# Patient Record
Sex: Female | Born: 1935 | Hispanic: No | Smoking: Never smoker
Health system: Southern US, Community
[De-identification: ages and names within clinical notes are randomized; demographics above are authoritative.]

## PROBLEM LIST (undated history)

## (undated) DIAGNOSIS — I509 Heart failure, unspecified: Secondary | ICD-10-CM

## (undated) DIAGNOSIS — M1990 Unspecified osteoarthritis, unspecified site: Secondary | ICD-10-CM

## (undated) DIAGNOSIS — C8589 Other specified types of non-Hodgkin lymphoma, extranodal and solid organ sites: Secondary | ICD-10-CM

## (undated) DIAGNOSIS — I10 Essential (primary) hypertension: Secondary | ICD-10-CM

## (undated) DIAGNOSIS — C8339 Primary central nervous system lymphoma: Secondary | ICD-10-CM

## (undated) HISTORY — DX: Heart failure, unspecified: I50.9

## (undated) HISTORY — DX: Essential (primary) hypertension: I10

## (undated) HISTORY — DX: Primary central nervous system lymphoma: C83.390

## (undated) HISTORY — DX: Other specified types of non-hodgkin lymphoma, extranodal and solid organ sites: C85.89

## (undated) HISTORY — DX: Unspecified osteoarthritis, unspecified site: M19.90

---

## 2005-01-03 ENCOUNTER — Ambulatory Visit: Payer: Self-pay

## 2005-01-10 ENCOUNTER — Ambulatory Visit: Payer: Self-pay

## 2005-10-14 ENCOUNTER — Ambulatory Visit: Payer: Self-pay

## 2006-04-24 ENCOUNTER — Other Ambulatory Visit: Payer: Self-pay

## 2006-04-24 ENCOUNTER — Emergency Department: Payer: Self-pay | Admitting: Unknown Physician Specialty

## 2006-07-15 ENCOUNTER — Emergency Department: Payer: Self-pay | Admitting: Unknown Physician Specialty

## 2006-09-11 ENCOUNTER — Ambulatory Visit: Payer: Self-pay

## 2006-10-22 ENCOUNTER — Ambulatory Visit: Payer: Self-pay

## 2006-12-17 ENCOUNTER — Ambulatory Visit: Payer: Self-pay

## 2007-09-06 ENCOUNTER — Ambulatory Visit: Payer: Self-pay | Admitting: Orthopedic Surgery

## 2007-09-06 ENCOUNTER — Other Ambulatory Visit: Payer: Self-pay

## 2007-09-13 ENCOUNTER — Inpatient Hospital Stay: Payer: Self-pay | Admitting: Orthopedic Surgery

## 2007-09-16 ENCOUNTER — Encounter: Payer: Self-pay | Admitting: Internal Medicine

## 2008-01-19 ENCOUNTER — Ambulatory Visit: Payer: Self-pay

## 2008-03-31 ENCOUNTER — Ambulatory Visit: Payer: Self-pay | Admitting: Orthopedic Surgery

## 2008-04-04 ENCOUNTER — Ambulatory Visit: Payer: Self-pay | Admitting: Orthopedic Surgery

## 2008-05-24 ENCOUNTER — Ambulatory Visit: Payer: Self-pay | Admitting: Neurology

## 2009-01-19 ENCOUNTER — Ambulatory Visit: Payer: Self-pay

## 2009-11-22 ENCOUNTER — Emergency Department: Payer: Self-pay | Admitting: Emergency Medicine

## 2010-01-21 ENCOUNTER — Ambulatory Visit: Payer: Self-pay

## 2010-03-25 ENCOUNTER — Ambulatory Visit: Payer: Self-pay | Admitting: Specialist

## 2010-03-25 ENCOUNTER — Ambulatory Visit: Payer: Self-pay | Admitting: Cardiology

## 2010-04-02 ENCOUNTER — Inpatient Hospital Stay: Payer: Self-pay | Admitting: Specialist

## 2010-04-06 ENCOUNTER — Other Ambulatory Visit: Payer: Self-pay | Admitting: Family Medicine

## 2010-04-07 ENCOUNTER — Other Ambulatory Visit: Payer: Self-pay | Admitting: Family Medicine

## 2011-03-12 ENCOUNTER — Ambulatory Visit: Payer: Self-pay

## 2012-07-08 ENCOUNTER — Ambulatory Visit: Payer: Self-pay

## 2014-04-11 ENCOUNTER — Ambulatory Visit: Payer: Self-pay

## 2015-10-02 ENCOUNTER — Other Ambulatory Visit: Payer: Self-pay | Admitting: Internal Medicine

## 2015-10-02 DIAGNOSIS — Z1231 Encounter for screening mammogram for malignant neoplasm of breast: Secondary | ICD-10-CM

## 2015-10-04 ENCOUNTER — Ambulatory Visit: Payer: Self-pay

## 2015-12-14 ENCOUNTER — Ambulatory Visit
Admission: RE | Admit: 2015-12-14 | Discharge: 2015-12-14 | Disposition: A | Discharge status: Home / Self Care | Payer: Medicare Other | Source: Ambulatory Visit | Attending: Internal Medicine | Admitting: Internal Medicine

## 2015-12-14 ENCOUNTER — Other Ambulatory Visit: Payer: Self-pay | Admitting: Internal Medicine

## 2015-12-14 DIAGNOSIS — Z1231 Encounter for screening mammogram for malignant neoplasm of breast: Secondary | ICD-10-CM | POA: Diagnosis present

## 2016-06-09 DIAGNOSIS — I729 Aneurysm of unspecified site: Secondary | ICD-10-CM

## 2016-06-09 HISTORY — DX: Aneurysm of unspecified site: I72.9

## 2016-09-28 ENCOUNTER — Emergency Department
Admission: EM | Admit: 2016-09-28 | Discharge: 2016-09-28 | Disposition: A | Discharge status: Other Acute Inpatient Hospital | Payer: Medicare Other | Attending: Emergency Medicine | Admitting: Emergency Medicine

## 2016-09-28 ENCOUNTER — Emergency Department: Payer: Medicare Other

## 2016-09-28 DIAGNOSIS — Z79899 Other long term (current) drug therapy: Secondary | ICD-10-CM | POA: Insufficient documentation

## 2016-09-28 DIAGNOSIS — I609 Nontraumatic subarachnoid hemorrhage, unspecified: Secondary | ICD-10-CM

## 2016-09-28 DIAGNOSIS — R4182 Altered mental status, unspecified: Secondary | ICD-10-CM | POA: Diagnosis present

## 2016-09-28 LAB — CBC WITH DIFFERENTIAL/PLATELET
Basophils Absolute: 0 10*3/uL (ref 0–0.1)
Basophils Relative: 0 %
EOS PCT: 0 %
Eosinophils Absolute: 0 10*3/uL (ref 0–0.7)
HCT: 37.3 % (ref 35.0–47.0)
Hemoglobin: 12.1 g/dL (ref 12.0–16.0)
LYMPHS PCT: 13 %
Lymphs Abs: 0.8 10*3/uL — ABNORMAL LOW (ref 1.0–3.6)
MCH: 26.6 pg (ref 26.0–34.0)
MCHC: 32.6 g/dL (ref 32.0–36.0)
MCV: 81.6 fL (ref 80.0–100.0)
MONO ABS: 0.3 10*3/uL (ref 0.2–0.9)
MONOS PCT: 6 %
NEUTROS ABS: 5.1 10*3/uL (ref 1.4–6.5)
Neutrophils Relative %: 81 %
Platelets: 266 10*3/uL (ref 150–440)
RBC: 4.57 MIL/uL (ref 3.80–5.20)
RDW: 14.4 % (ref 11.5–14.5)
WBC: 6.3 10*3/uL (ref 3.6–11.0)

## 2016-09-28 LAB — URINALYSIS, COMPLETE (UACMP) WITH MICROSCOPIC
BACTERIA UA: NONE SEEN
Bilirubin Urine: NEGATIVE
GLUCOSE, UA: NEGATIVE mg/dL
KETONES UR: 5 mg/dL — AB
Leukocytes, UA: NEGATIVE
Nitrite: NEGATIVE
PROTEIN: 100 mg/dL — AB
Specific Gravity, Urine: 1.016 (ref 1.005–1.030)
pH: 7 (ref 5.0–8.0)

## 2016-09-28 LAB — BASIC METABOLIC PANEL
Anion gap: 8 (ref 5–15)
BUN: 10 mg/dL (ref 6–20)
CALCIUM: 8.9 mg/dL (ref 8.9–10.3)
CHLORIDE: 100 mmol/L — AB (ref 101–111)
CO2: 26 mmol/L (ref 22–32)
CREATININE: 0.54 mg/dL (ref 0.44–1.00)
GFR calc non Af Amer: >60 mL/min (ref >60)
GLUCOSE: 138 mg/dL — AB (ref 65–99)
Potassium: 3.6 mmol/L (ref 3.5–5.1)
Sodium: 134 mmol/L — ABNORMAL LOW (ref 135–145)

## 2016-09-28 LAB — TROPONIN I: Troponin I: <0.03 ng/mL (ref <0.03)

## 2016-09-28 LAB — TSH: TSH: 0.354 u[IU]/mL (ref 0.350–4.500)

## 2016-09-28 MED ORDER — PROPOFOL 10 MG/ML IV BOLUS
1.0000 mg/kg | Freq: Once | INTRAVENOUS | Status: AC
  Administered 2016-09-28: 86.2 mg via INTRAVENOUS

## 2016-09-28 MED ORDER — SUCCINYLCHOLINE CHLORIDE 20 MG/ML IJ SOLN
INTRAMUSCULAR | Status: AC
  Administered 2016-09-28: 100 mg via INTRAVENOUS
  Filled 2016-09-28: qty 1

## 2016-09-28 MED ORDER — PROPOFOL 1000 MG/100ML IV EMUL
5.0000 ug/kg/min | Freq: Once | INTRAVENOUS | Status: AC
  Administered 2016-09-28: 38.536 ug/kg/min via INTRAVENOUS
  Administered 2016-09-28: 5 ug/kg/min via INTRAVENOUS
  Administered 2016-09-28: 50 ug/kg/min via INTRAVENOUS

## 2016-09-28 MED ORDER — SODIUM CHLORIDE 0.9 % IV SOLN
2000.0000 mg | Freq: Once | INTRAVENOUS | Status: DC
  Filled 2016-09-28: qty 20

## 2016-09-28 MED ORDER — PROPOFOL 1000 MG/100ML IV EMUL
INTRAVENOUS | Status: AC
  Filled 2016-09-28: qty 100

## 2016-09-28 MED ORDER — NICARDIPINE HCL IN NACL 20-0.86 MG/200ML-% IV SOLN
0.0000 mg/h | INTRAVENOUS | Status: DC
  Administered 2016-09-28: 5 mg/h via INTRAVENOUS
  Filled 2016-09-28: qty 200

## 2016-09-28 MED ORDER — SUCCINYLCHOLINE CHLORIDE 20 MG/ML IJ SOLN
100.0000 mg | Freq: Once | INTRAMUSCULAR | Status: AC
  Administered 2016-09-28: 100 mg via INTRAVENOUS

## 2016-09-28 NOTE — ED Notes (Signed)
MD and respiratory at bedside for intubation

## 2016-09-28 NOTE — ED Triage Notes (Signed)
Pt presents to ED via EMS from home with lethargy, weakness since 3 am this morning. Pt is lethargic , responds to sternal rub on arrival

## 2016-09-28 NOTE — ED Provider Notes (Signed)
[facility] Emergency Department Provider Note    ____________________________________________   I have reviewed the triage vital signs and the nursing notes.   HISTORY  Chief Complaint Altered Mental Status   History limited by: Altered Mental Status   HPI Sheila Krueger is a 82 y.o. female who presents to the emergency department today via EMS because of concerns for altered mental status. Apparently the last time she was seen normal was roughly 12 hours ago. Upon awakening today however she is been quite lethargic for family. She has been very altered. For EMS she was only able to tell him her name and date of birth. They did state she also reacted to the IV start. They reported past medical history of hypertension.   No past medical history on file.  There are no active problems to display for this patient.   No past surgical history on file.  Prior to Admission medications   Not on File    Allergies Patient has no allergy information on record.  No family history on file.  Social History Social History  Substance Use Topics  . Smoking status: Not on file  . Smokeless tobacco: Not on file  . Alcohol use Not on file    Review of Systems Unable to obtain secondary to altered mental status.  ____________________________________________   PHYSICAL EXAM:  VITAL SIGNS: ED Triage Vitals  Enc Vitals Group     BP 09/28/16 1425 (!) 166/84     Pulse Rate 09/28/16 1425 85     Resp 09/28/16 1425 17     Temp 09/28/16 1425 98.1 F (36.7 C)     Temp Source 09/28/16 1425 Oral     SpO2 09/28/16 1425 95 %     Weight --      Height 09/28/16 1426 5\' 7"  (1.702 m)   Constitutional: Somnolent. Will grunt to painful stimuli.  Eyes: Conjunctivae are normal. Normal extraocular movements. ENT   Head: Normocephalic and atraumatic.   Nose: No congestion/rhinnorhea.   Mouth/Throat: Mucous membranes are moist.   Neck: No  stridor. Hematological/Lymphatic/Immunilogical: No cervical lymphadenopathy. Cardiovascular: Normal rate, regular rhythm.  No murmurs, rubs, or gallops.  Respiratory: Normal respiratory effort without tachypnea nor retractions. Breath sounds are clear and equal bilaterally. No wheezes/rales/rhonchi. Gastrointestinal: Soft and non tender. No rebound. No guarding.  Genitourinary: Deferred Musculoskeletal: Normal range of motion in all extremities. No lower extremity edema. Neurologic:  Somnelent. Will grunt to painful stimuli. Appears to move all extremities.  Skin:  Skin is warm, dry and intact. No rash noted.  ____________________________________________    LABS (pertinent positives/negatives)  Labs Reviewed  CBC WITH DIFFERENTIAL/PLATELET - Abnormal; Notable for the following:       Result Value   Lymphs Abs 0.8 (*)    All other components within normal limits  BASIC METABOLIC PANEL - Abnormal; Notable for the following:    Sodium 134 (*)    Chloride 100 (*)    Glucose, Bld 138 (*)    All other components within normal limits  TSH  TROPONIN I  URINALYSIS, COMPLETE (UACMP) WITH MICROSCOPIC     ____________________________________________   EKG  None  ____________________________________________    RADIOLOGY  CT head IMPRESSION:  Relatively extensive subarachnoid hemorrhage with mild  hydrocephalus. Recommend CTA to evaluate for aneurysm.     CXR IMPRESSION:  ETT tip is 2.2 cm above the carina. No acute abnormality.   I, Nance Pear, personally viewed and evaluated these images (cxr and  head ct) as part of my medical decision making, as well as reviewing the written report by the radiologist.   ____________________________________________   PROCEDURES  Procedures  CRITICAL CARE Performed by: Nance Pear   Total critical care time: 45 minutes  Critical care time was exclusive of separately billable procedures and treating other  patients.  Critical care was necessary to treat or prevent imminent or life-threatening deterioration.  Critical care was time spent personally by me on the following activities: development of treatment plan with patient and/or surrogate as well as nursing, discussions with consultants, evaluation of patient's response to treatment, examination of patient, obtaining history from patient or surrogate, ordering and performing treatments and interventions, ordering and review of laboratory studies, ordering and review of radiographic studies, pulse oximetry and re-evaluation of patient's condition.  INTUBATION Performed by: Nance Pear  Required items: required blood products, implants, devices, and special equipment available Patient identity confirmed: provided demographic data and hospital-assigned identification number Time out: Immediately prior to procedure a "time out" was called to verify the correct patient, procedure, equipment, support staff and site/side marked as required.  Indications: Altered mental status, airway protection  Intubation method: Glidescope   Preoxygenation: BVM  Sedatives: Propofol Paralytic: Succinylcholine  Tube Size: 8-0 cuffed  Post-procedure assessment: chest rise and ETCO2 monitor Breath sounds: equal and absent over the epigastrium Tube secured with: ETT holder Chest x-ray interpreted by radiologist and me.  Chest x-ray findings: endotracheal tube in appropriate position  Patient tolerated the procedure well with no immediate complications.    ____________________________________________   INITIAL IMPRESSION / ASSESSMENT AND PLAN / ED COURSE  Pertinent labs & imaging results that were available during my care of the patient were reviewed by me and considered in my medical decision making (see chart for details).  Patient presented to the emergency department today because of concerns for altered mental status. The patient was  significantly altered upon arrival to the emergency department. She would grunt to painful stimuli. CT head was performed which was concerning for subarachnoid hemorrhage. This certainly would explain the patient's symptoms. During her stay here in the emergency department she did become somewhat less responsive to painful stimuli. The decision was made to intubate the patient to protect the airway. The patient was arranged to be transferred to [facility] for neuro ICU. Per accepting doctor's recommendation Keppra was given. Goal SBP of 140, patient was written for nicardipine drip.   ____________________________________________   FINAL CLINICAL IMPRESSION(S) / ED DIAGNOSES  Final diagnoses:  Altered mental status, unspecified altered mental status type  SAH (subarachnoid hemorrhage) ([place])     Note: This dictation was prepared with Dragon dictation. Any transcriptional errors that result from this process are unintentional     Nance Pear, MD 09/29/16 1204

## 2016-09-28 NOTE — ED Notes (Signed)
Pt with [facility] for transfer

## 2016-09-28 NOTE — ED Notes (Signed)
Post insertion of foley pt aroused, pulling at tube.

## 2016-09-28 NOTE — ED Notes (Signed)
MD discussed with family/daughter at bedside CT results and need for further treatment for intubation. Respiratory called to bedside, Theadora Rama Rn  At bedside

## 2018-07-23 ENCOUNTER — Other Ambulatory Visit: Payer: Self-pay | Admitting: Neurology

## 2018-07-23 DIAGNOSIS — H539 Unspecified visual disturbance: Secondary | ICD-10-CM

## 2018-08-03 ENCOUNTER — Ambulatory Visit
Admission: RE | Admit: 2018-08-03 | Discharge: 2018-08-03 | Disposition: A | Discharge status: Home / Self Care | Payer: Medicare Other | Source: Ambulatory Visit | Attending: Neurology | Admitting: Neurology

## 2018-08-03 DIAGNOSIS — G319 Degenerative disease of nervous system, unspecified: Secondary | ICD-10-CM | POA: Diagnosis not present

## 2018-08-03 DIAGNOSIS — H539 Unspecified visual disturbance: Secondary | ICD-10-CM

## 2018-08-03 DIAGNOSIS — I6523 Occlusion and stenosis of bilateral carotid arteries: Secondary | ICD-10-CM | POA: Insufficient documentation

## 2019-06-28 ENCOUNTER — Encounter: Payer: Self-pay | Admitting: Emergency Medicine

## 2019-06-28 ENCOUNTER — Emergency Department: Payer: Medicare PPO

## 2019-06-28 ENCOUNTER — Emergency Department
Admission: EM | Admit: 2019-06-28 | Discharge: 2019-06-28 | Disposition: A | Discharge status: Other Acute Inpatient Hospital | Payer: Medicare PPO | Attending: Emergency Medicine | Admitting: Emergency Medicine

## 2019-06-28 ENCOUNTER — Other Ambulatory Visit: Payer: Self-pay

## 2019-06-28 DIAGNOSIS — R531 Weakness: Secondary | ICD-10-CM | POA: Diagnosis not present

## 2019-06-28 DIAGNOSIS — Z20822 Contact with and (suspected) exposure to covid-19: Secondary | ICD-10-CM | POA: Diagnosis not present

## 2019-06-28 DIAGNOSIS — R410 Disorientation, unspecified: Secondary | ICD-10-CM | POA: Insufficient documentation

## 2019-06-28 DIAGNOSIS — G9389 Other specified disorders of brain: Secondary | ICD-10-CM

## 2019-06-28 DIAGNOSIS — R4182 Altered mental status, unspecified: Secondary | ICD-10-CM | POA: Diagnosis present

## 2019-06-28 DIAGNOSIS — I10 Essential (primary) hypertension: Secondary | ICD-10-CM | POA: Insufficient documentation

## 2019-06-28 DIAGNOSIS — Z8679 Personal history of other diseases of the circulatory system: Secondary | ICD-10-CM | POA: Insufficient documentation

## 2019-06-28 DIAGNOSIS — R900 Intracranial space-occupying lesion found on diagnostic imaging of central nervous system: Secondary | ICD-10-CM | POA: Diagnosis not present

## 2019-06-28 LAB — CBC WITH DIFFERENTIAL/PLATELET
Abs Immature Granulocytes: 0.02 10*3/uL (ref 0.00–0.07)
Basophils Absolute: 0 10*3/uL (ref 0.0–0.1)
Basophils Relative: 0 %
Eosinophils Absolute: 0.1 10*3/uL (ref 0.0–0.5)
Eosinophils Relative: 1 %
HCT: 42.3 % (ref 36.0–46.0)
Hemoglobin: 13.1 g/dL (ref 12.0–15.0)
Immature Granulocytes: 0 %
Lymphocytes Relative: 23 %
Lymphs Abs: 1.6 10*3/uL (ref 0.7–4.0)
MCH: 26.5 pg (ref 26.0–34.0)
MCHC: 31 g/dL (ref 30.0–36.0)
MCV: 85.5 fL (ref 80.0–100.0)
Monocytes Absolute: 0.5 10*3/uL (ref 0.1–1.0)
Monocytes Relative: 7 %
Neutro Abs: 4.8 10*3/uL (ref 1.7–7.7)
Neutrophils Relative %: 69 %
Platelets: 184 10*3/uL (ref 150–400)
RBC: 4.95 MIL/uL (ref 3.87–5.11)
RDW: 13.8 % (ref 11.5–15.5)
WBC: 7.1 10*3/uL (ref 4.0–10.5)
nRBC: 0 % (ref 0.0–0.2)

## 2019-06-28 LAB — URINALYSIS, COMPLETE (UACMP) WITH MICROSCOPIC
Bacteria, UA: NONE SEEN
Bilirubin Urine: NEGATIVE
Glucose, UA: NEGATIVE mg/dL
Ketones, ur: NEGATIVE mg/dL
Leukocytes,Ua: NEGATIVE
Nitrite: NEGATIVE
Protein, ur: NEGATIVE mg/dL
Specific Gravity, Urine: 1.023 (ref 1.005–1.030)
pH: 5 (ref 5.0–8.0)

## 2019-06-28 LAB — COMPREHENSIVE METABOLIC PANEL
ALT: 15 U/L (ref 0–44)
AST: 25 U/L (ref 15–41)
Albumin: 4.2 g/dL (ref 3.5–5.0)
Alkaline Phosphatase: 61 U/L (ref 38–126)
Anion gap: 13 (ref 5–15)
BUN: 16 mg/dL (ref 8–23)
CO2: 28 mmol/L (ref 22–32)
Calcium: 9.6 mg/dL (ref 8.9–10.3)
Chloride: 103 mmol/L (ref 98–111)
Creatinine, Ser: 0.62 mg/dL (ref 0.44–1.00)
GFR calc Af Amer: >60 mL/min (ref >60)
GFR calc non Af Amer: >60 mL/min (ref >60)
Glucose, Bld: 95 mg/dL (ref 70–99)
Potassium: 3.4 mmol/L — ABNORMAL LOW (ref 3.5–5.1)
Sodium: 144 mmol/L (ref 135–145)
Total Bilirubin: 0.7 mg/dL (ref 0.3–1.2)
Total Protein: 8 g/dL (ref 6.5–8.1)

## 2019-06-28 LAB — RESPIRATORY PANEL BY RT PCR (FLU A&B, COVID)
Influenza A by PCR: NEGATIVE
Influenza B by PCR: NEGATIVE
SARS Coronavirus 2 by RT PCR: NEGATIVE

## 2019-06-28 LAB — TROPONIN I (HIGH SENSITIVITY)
Troponin I (High Sensitivity): 111 ng/L (ref <18)
Troponin I (High Sensitivity): 128 ng/L (ref <18)

## 2019-06-28 MED ORDER — DEXAMETHASONE SODIUM PHOSPHATE 10 MG/ML IJ SOLN: 10.0000 mg | Freq: Once | INTRAMUSCULAR | Status: DC

## 2019-06-28 MED ORDER — ACETAMINOPHEN 500 MG PO TABS
1000.0000 mg | ORAL_TABLET | Freq: Once | ORAL | Status: AC
  Administered 2019-06-28: 1000 mg via ORAL
  Filled 2019-06-28: qty 2

## 2019-06-28 MED ORDER — GADOBUTROL 1 MMOL/ML IV SOLN
8.0000 mL | Freq: Once | INTRAVENOUS | Status: AC | PRN
  Administered 2019-06-28: 10 mL via INTRAVENOUS

## 2019-06-28 NOTE — ED Provider Notes (Addendum)
[facility] Emergency Department Provider Note       Time seen: ----------------------------------------- 11:39 AM on 06/28/2019 -----------------------------------------   I have reviewed the triage vital signs and the nursing notes.  HISTORY   Chief Complaint No chief complaint on file.    HPI Sheila Krueger is a 85 y.o. female with a history of brain aneurysm, hypertension who presents to the ED for confusion and altered mental status.  Reportedly she was on the commode for about 6 hours today.  Patient called her niece who then contacted EMS to transport her to the hospital.  She has been confused, symptoms seem to start around Sunday.  She has not had any recent sickness.  No past medical history on file.  There are no problems to display for this patient.   No past surgical history on file.  Allergies Patient has no allergy information on record.  Social History Social History   Tobacco Use  . Smoking status: Not on file  Substance Use Topics  . Alcohol use: Not on file  . Drug use: Not on file    Review of Systems Constitutional: Negative for fever. Cardiovascular: Negative for chest pain. Respiratory: Negative for shortness of breath. Gastrointestinal: Negative for abdominal pain, positive for possible diarrhea Musculoskeletal: Negative for back pain. Skin: Negative for rash. Neurological: Positive for weakness  All systems negative/normal/unremarkable except as stated in the HPI  ____________________________________________   PHYSICAL EXAM:  VITAL SIGNS: ED Triage Vitals  Enc Vitals Group     BP      Pulse      Resp      Temp      Temp src      SpO2      Weight      Height      Head Circumference      Peak Flow      Pain Score      Pain Loc      Pain Edu?      Excl. in [place]?     Constitutional: Alert but disoriented.  Well appearing and in no distress. Eyes: Conjunctivae are normal. Normal extraocular  movements. ENT      Head: Normocephalic and atraumatic.      Nose: No congestion/rhinnorhea.      Mouth/Throat: Mucous membranes are moist.      Neck: No stridor. Cardiovascular: Normal rate, regular rhythm. No murmurs, rubs, or gallops. Respiratory: Normal respiratory effort without tachypnea nor retractions. Breath sounds are clear and equal bilaterally. No wheezes/rales/rhonchi. Gastrointestinal: Soft and nontender. Normal bowel sounds Musculoskeletal: Nontender with normal range of motion in extremities. No lower extremity tenderness nor edema. Neurologic:  Normal speech and language. No gross focal neurologic deficits are appreciated.  Patient is confused but follows commands well.  Generalized weakness. Skin:  Skin is warm, dry and intact.  Neurofibromatosis noted Psychiatric: Mood and affect are normal.  ___________________________________________  ED COURSE:  As part of my medical decision making, I reviewed the following data within the [place] History obtained from family if available, nursing notes, old chart and ekg, as well as notes from prior ED visits. Patient presented for altered renal status and weakness, we will assess with labs and imaging as indicated at this time. Clinical Course as of Jun 28 1315  Tue Jun 28, 2019  1306 Patient discussed with neurosurgery on-call, recommended transfer to [facility] for biopsy.   [JW]    Clinical Course User  Index [JW] Earleen Newport, MD   Procedures  Riyan Tammica Bergmann was evaluated in Emergency Department on 06/28/2019 for the symptoms described in the history of present illness. She was evaluated in the context of the global COVID-19 pandemic, which necessitated consideration that the patient might be at risk for infection with the SARS-CoV-2 virus that causes COVID-19. Institutional protocols and algorithms that pertain to the evaluation of patients at risk for COVID-19 are in a state  of rapid change based on information released by regulatory bodies including the CDC and federal and state organizations. These policies and algorithms were followed during the patient's care in the ED.  ____________________________________________   EKG: Interpreted by me, sinus rhythm with rate of 77 bpm, low voltage, normal axis, long QT  LABS (pertinent positives/negatives)  Labs Reviewed  COMPREHENSIVE METABOLIC PANEL - Abnormal; Notable for the following components:      Result Value   Potassium 3.4 (*)    All other components within normal limits  TROPONIN I (HIGH SENSITIVITY) - Abnormal; Notable for the following components:   Troponin I (High Sensitivity) 111 (*)    All other components within normal limits  CBC WITH DIFFERENTIAL/PLATELET  URINALYSIS, COMPLETE (UACMP) WITH MICROSCOPIC  CBG MONITORING, ED    RADIOLOGY Images were viewed by me  CT head, chest x-ray  IMPRESSION:  Stable elevation the right hemidiaphragm. Slight bibasilar  atelectasis. Lungs elsewhere clear. Heart upper normal in size.  Bones osteoporotic. Aortic Atherosclerosis (ICD10-I70.0).   IMPRESSION:  1. Lobulated area of hyperdensity in the central cerebrum  surrounding the frontal horns, in the caudate area and crossing the  midline. 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 Glioma would be a  possibility and much less likely this could be unusual manifestation  of hemorrhage. Recommend MRI brain without and with contrast for  further evaluation.  2. 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  ____________________________________________   DIFFERENTIAL DIAGNOSIS   Dehydration, electrolyte abnormality, COVID-19, CVA, TIA, MI, occult infection  FINAL ASSESSMENT AND PLAN   Weakness, altered mental status, CNS mass, elevated troponin   Plan: The patient had presented for weakness and confusion. Patient's labs did reveal an elevated troponin of uncertain significance. Patient's imaging did reveal lobulated  area of hyperdensity in the central cerebrum surrounding the frontal horns and crossing the midline likely reflecting lymphoma.  I have discussed with neurosurgery who recommends transfer to [facility] for biopsy.  Neurologically she is intact at this time although is confused, easily follows commands.  Neurosurgery recommended against Decadron at this time.  We will attempt transfer to [facility].   Laurence Aly, MD    Note: This note was generated in part or whole with voice recognition software. Voice recognition is usually quite accurate but there are transcription errors that can and very often do occur. I apologize for any typographical errors that were not detected and corrected.     Earleen Newport, MD 06/28/19 1319    Earleen Newport, MD 06/28/19 1538

## 2019-06-28 NOTE — ED Notes (Signed)
Spoke with Mandriel great-nephew, inventoried patient belongings to be sent with the patient via EMS:   Bra, gray shirt, phone and charger, pants, underwear, socks

## 2019-06-28 NOTE — ED Triage Notes (Signed)
Pt via ems from home with increased confusion sine Sunday. Pt's niece called ems because pt called her and told her that she had been on the toilet for 6 hours. Pt oriented to self and thinks she may be at the hospital, but not oriented to time or situation. Pt alert, attempts to answer questions. NAD noted.

## 2019-06-28 NOTE — ED Notes (Signed)
[facility]  CALLED  PER  DR  Jimmye Norman  MD

## 2019-06-28 NOTE — ED Notes (Signed)
Unable to print labels; sunquest says I have inadequate permissions

## 2019-06-28 NOTE — ED Notes (Signed)
Pt to mri 

## 2019-06-28 NOTE — ED Notes (Signed)
Called MRI to see when pt is scheduled; they said they could not tell me when but said not many in front of her.

## 2019-06-28 NOTE — ED Provider Notes (Signed)
Patient excepted in transfer to [facility].   Earleen Newport, MD 06/28/19 1341

## 2019-06-28 NOTE — ED Notes (Signed)
XRAY  POWERSHARE  WITH  [facility] 

## 2019-06-28 NOTE — ED Notes (Signed)
Report given to Moshe Salisbury, RN at [facility]. They are unable to accept patient before 7PM due to staffing. She took report and asked that we send lab results, etc. Confirmed that will be in the packet. Pt assigned to room 7109.

## 2019-06-28 NOTE — ED Notes (Signed)
EMTALA and Medical Necessity documentation reviewed at this time and found to be complete per policy. 

## 2019-06-28 NOTE — ED Notes (Signed)
In & out cath performed for urine

## 2019-06-29 MED ORDER — SENNOSIDES-DOCUSATE SODIUM 8.6-50 MG PO TABS
2.00 | ORAL_TABLET | ORAL | Status: DC
Start: 2019-06-29 — End: 2019-06-29

## 2019-06-29 MED ORDER — HYDROCHLOROTHIAZIDE 25 MG PO TABS
25.00 | ORAL_TABLET | ORAL | Status: DC
Start: 2019-06-30 — End: 2019-06-29

## 2019-06-29 MED ORDER — ATORVASTATIN CALCIUM 40 MG PO TABS
40.00 | ORAL_TABLET | ORAL | Status: DC
Start: 2019-06-29 — End: 2019-06-29

## 2019-06-29 MED ORDER — ACETAMINOPHEN 325 MG PO TABS
650.00 | ORAL_TABLET | ORAL | Status: DC
Start: ? — End: 2019-06-29

## 2019-06-29 MED ORDER — ONDANSETRON HCL 4 MG/2ML IJ SOLN
4.00 | INTRAMUSCULAR | Status: DC
Start: ? — End: 2019-06-29

## 2019-06-29 MED ORDER — SODIUM CHLORIDE 0.9 % IV SOLN
INTRAVENOUS | Status: DC
Start: 2019-06-30 — End: 2019-06-29

## 2019-06-29 MED ORDER — PANTOPRAZOLE SODIUM 40 MG PO TBEC
40.00 | DELAYED_RELEASE_TABLET | ORAL | Status: DC
Start: 2019-06-30 — End: 2019-06-29

## 2019-06-29 MED ORDER — TRAZODONE HCL 50 MG PO TABS
50.00 | ORAL_TABLET | ORAL | Status: DC
Start: ? — End: 2019-06-29

## 2019-06-29 MED ORDER — SODIUM CHLORIDE FLUSH 0.9 % IV SOLN
5.00 | INTRAVENOUS | Status: DC
Start: 2019-06-29 — End: 2019-06-29

## 2019-06-29 MED ORDER — LIDOCAINE HCL 1 % IJ SOLN
0.50 | INTRAMUSCULAR | Status: DC
Start: ? — End: 2019-06-29

## 2019-06-29 MED ORDER — POLYETHYLENE GLYCOL 3350 17 GM/SCOOP PO POWD
17.00 | ORAL | Status: DC
Start: 2019-06-29 — End: 2019-06-29

## 2019-07-11 ENCOUNTER — Emergency Department
Admission: EM | Admit: 2019-07-11 | Discharge: 2019-07-11 | Disposition: A | Discharge status: Home / Self Care | Payer: Medicare PPO | Attending: Emergency Medicine | Admitting: Emergency Medicine

## 2019-07-11 ENCOUNTER — Encounter: Payer: Self-pay | Admitting: Emergency Medicine

## 2019-07-11 ENCOUNTER — Ambulatory Visit: Payer: Medicare PPO | Attending: Internal Medicine

## 2019-07-11 ENCOUNTER — Other Ambulatory Visit: Payer: Self-pay

## 2019-07-11 ENCOUNTER — Inpatient Hospital Stay: Payer: Medicare PPO | Attending: Oncology | Admitting: Oncology

## 2019-07-11 ENCOUNTER — Inpatient Hospital Stay: Payer: Medicare PPO

## 2019-07-11 ENCOUNTER — Encounter: Payer: Self-pay | Admitting: Oncology

## 2019-07-11 VITALS — BP 130/69 | HR 82 | Temp 99.1°F | Resp 18 | Wt 190.0 lb

## 2019-07-11 DIAGNOSIS — Z466 Encounter for fitting and adjustment of urinary device: Secondary | ICD-10-CM | POA: Insufficient documentation

## 2019-07-11 DIAGNOSIS — I700 Atherosclerosis of aorta: Secondary | ICD-10-CM | POA: Insufficient documentation

## 2019-07-11 DIAGNOSIS — Z79899 Other long term (current) drug therapy: Secondary | ICD-10-CM

## 2019-07-11 DIAGNOSIS — M810 Age-related osteoporosis without current pathological fracture: Secondary | ICD-10-CM | POA: Diagnosis not present

## 2019-07-11 DIAGNOSIS — Z7189 Other specified counseling: Secondary | ICD-10-CM

## 2019-07-11 DIAGNOSIS — C8589 Other specified types of non-Hodgkin lymphoma, extranodal and solid organ sites: Secondary | ICD-10-CM

## 2019-07-11 DIAGNOSIS — Z8249 Family history of ischemic heart disease and other diseases of the circulatory system: Secondary | ICD-10-CM

## 2019-07-11 DIAGNOSIS — C8339 Diffuse large B-cell lymphoma, extranodal and solid organ sites: Secondary | ICD-10-CM

## 2019-07-11 DIAGNOSIS — R410 Disorientation, unspecified: Secondary | ICD-10-CM | POA: Diagnosis not present

## 2019-07-11 DIAGNOSIS — Z20822 Contact with and (suspected) exposure to covid-19: Secondary | ICD-10-CM

## 2019-07-11 DIAGNOSIS — G939 Disorder of brain, unspecified: Secondary | ICD-10-CM | POA: Insufficient documentation

## 2019-07-11 DIAGNOSIS — I10 Essential (primary) hypertension: Secondary | ICD-10-CM | POA: Diagnosis not present

## 2019-07-11 DIAGNOSIS — R220 Localized swelling, mass and lump, head: Secondary | ICD-10-CM | POA: Insufficient documentation

## 2019-07-11 DIAGNOSIS — Z982 Presence of cerebrospinal fluid drainage device: Secondary | ICD-10-CM

## 2019-07-11 LAB — CBC WITH DIFFERENTIAL/PLATELET
Abs Immature Granulocytes: 0.02 10*3/uL (ref 0.00–0.07)
Basophils Absolute: 0 10*3/uL (ref 0.0–0.1)
Basophils Relative: 1 %
Eosinophils Absolute: 0.2 10*3/uL (ref 0.0–0.5)
Eosinophils Relative: 2 %
HCT: 40.6 % (ref 36.0–46.0)
Hemoglobin: 12.2 g/dL (ref 12.0–15.0)
Immature Granulocytes: 0 %
Lymphocytes Relative: 24 %
Lymphs Abs: 1.5 10*3/uL (ref 0.7–4.0)
MCH: 26.2 pg (ref 26.0–34.0)
MCHC: 30 g/dL (ref 30.0–36.0)
MCV: 87.1 fL (ref 80.0–100.0)
Monocytes Absolute: 0.4 10*3/uL (ref 0.1–1.0)
Monocytes Relative: 6 %
Neutro Abs: 4.2 10*3/uL (ref 1.7–7.7)
Neutrophils Relative %: 67 %
Platelets: 262 10*3/uL (ref 150–400)
RBC: 4.66 MIL/uL (ref 3.87–5.11)
RDW: 14.1 % (ref 11.5–15.5)
WBC: 6.3 10*3/uL (ref 4.0–10.5)
nRBC: 0 % (ref 0.0–0.2)

## 2019-07-11 LAB — LACTATE DEHYDROGENASE: LDH: 194 U/L — ABNORMAL HIGH (ref 98–192)

## 2019-07-11 LAB — COMPREHENSIVE METABOLIC PANEL
ALT: 20 U/L (ref 0–44)
AST: 18 U/L (ref 15–41)
Albumin: 4 g/dL (ref 3.5–5.0)
Alkaline Phosphatase: 65 U/L (ref 38–126)
Anion gap: 9 (ref 5–15)
BUN: 16 mg/dL (ref 8–23)
CO2: 26 mmol/L (ref 22–32)
Calcium: 9.5 mg/dL (ref 8.9–10.3)
Chloride: 101 mmol/L (ref 98–111)
Creatinine, Ser: 0.63 mg/dL (ref 0.44–1.00)
GFR calc Af Amer: >60 mL/min (ref >60)
GFR calc non Af Amer: >60 mL/min (ref >60)
Glucose, Bld: 106 mg/dL — ABNORMAL HIGH (ref 70–99)
Potassium: 4.1 mmol/L (ref 3.5–5.1)
Sodium: 136 mmol/L (ref 135–145)
Total Bilirubin: 0.5 mg/dL (ref 0.3–1.2)
Total Protein: 7.9 g/dL (ref 6.5–8.1)

## 2019-07-11 LAB — URIC ACID: Uric Acid, Serum: 7.2 mg/dL — ABNORMAL HIGH (ref 2.5–7.1)

## 2019-07-11 MED ORDER — PANTOPRAZOLE SODIUM 20 MG PO TBEC: 20.0000 mg | DELAYED_RELEASE_TABLET | Freq: Every day | ORAL | 0 refills | Status: AC

## 2019-07-11 MED ORDER — DEXAMETHASONE 4 MG PO TABS: 4.0000 mg | ORAL_TABLET | Freq: Two times a day (BID) | ORAL | 0 refills | Status: DC

## 2019-07-11 NOTE — ED Notes (Signed)
Assisted pt to the bathroom to obtain a urine sample but the pt was unable to use the bathroom. When asked if she had finished using the bathroom the pt stated "I think so, I don't know for sure".

## 2019-07-11 NOTE — ED Notes (Signed)
Went over DC instructions w/ pt's Niece. Informed her pt was not able to use the bathroom at the ED. The pt's niece stated to me "she had a UTI and it takes about 8 hours or so for her to start going on her own" I showed the niece on the DC paperwork where the pt needs to try every 2 hours to urinate. Pt's niece stated she or the pt's caregiver would be w/ the pt for the rest of the day and that the pt had 24 hour care. I informed the niece that if the pt could not urinate she would need another catheter placed per DC instructions.

## 2019-07-11 NOTE — Discharge Instructions (Signed)
Please attempt to urinate every 2 hours. If you are unable to urinate on your own, please notify home health or return to the ER for reinsertion.

## 2019-07-11 NOTE — ED Notes (Signed)
Pt here today referred by her Neurologist from [place] to have her urinary catheter removed. Pt's daughter is in flex waiting to take pt home and states she can help if there are any questions.

## 2019-07-11 NOTE — Progress Notes (Signed)
Pt's family here with her. She was dx with cns lymphoma and according to family pt will go to [facility] and take 3 treatments and radiation to make sure everything is going well and then can trf to [place] to cont. Care.

## 2019-07-11 NOTE — ED Provider Notes (Signed)
[facility] Emergency Department Provider Note  ____________________________________________  Time seen: Approximately 4:14 PM  I have reviewed the triage vital signs and the nursing notes.   HISTORY  Chief Complaint Catheter Removal   HPI Sheila Krueger is a 85 y.o. female who presents to the emergency department for removal of foley catheter. Per her caregiver, she was unable to go to [facility] for removal. Patient denies complaint except that the catheter is very uncomfortable.  Past Medical History:  Diagnosis Date  . Aneurysm ([place]) 2018   brain  . Arthritis   . CNS lymphoma ([place])   . Hypertension     There are no problems to display for this patient.   History reviewed. No pertinent surgical history.  Prior to Admission medications   Medication Sig Start Date End Date Taking? Authorizing Provider  acetaminophen (TYLENOL 8 HOUR ARTHRITIS PAIN) 650 MG CR tablet Take 650 mg by mouth every 8 (eight) hours as needed for pain.    [provider]  dexamethasone (DECADRON) 4 MG tablet Take 1 tablet (4 mg total) by mouth 2 (two) times daily with a meal. 07/11/19   Sindy Guadeloupe, MD  hydrochlorothiazide (HYDRODIURIL) 25 MG tablet Take 25 mg by mouth daily. 02/10/19   [provider]  ibuprofen (ADVIL) 200 MG tablet Take 200-800 mg by mouth every 6 (six) hours as needed for pain.    [provider]  pantoprazole (PROTONIX) 20 MG tablet Take 1 tablet (20 mg total) by mouth daily. 07/11/19   Sindy Guadeloupe, MD    Allergies Patient has no known allergies.  Family History  Problem Relation Age of Onset  . Hypertension Sister     Social History Social History   Tobacco Use  . Smoking status: Never Smoker  . Smokeless tobacco: Never Used  Substance Use Topics  . Alcohol use: Not Currently    Comment: rare beer  . Drug use: Not Currently    Review of Systems Constitutional: Negaitve for fever. Respiratory: Negative for  cough. Gastrointestinal: No abdominal pain.  No nausea, no vomiting.  No diarrhea.  Genitourinary: Positive for foley catheter Musculoskeletal: Negaitve for generalized body aches. Skin: Negative for rash/lesion/wound. Neurological: Negative for headaches, focal weakness or numbness.  ____________________________________________   PHYSICAL EXAM:  VITAL SIGNS: ED Triage Vitals  Enc Vitals Group     BP 07/11/19 1356 133/63     Pulse Rate 07/11/19 1356 90     Resp 07/11/19 1356 16     Temp 07/11/19 1356 98.4 F (36.9 C)     Temp Source 07/11/19 1356 Oral     SpO2 07/11/19 1356 98 %     Weight 07/11/19 1357 189 lb 9.5 oz (86 kg)     Height 07/11/19 1357 5\' 2"  (1.575 m)     Head Circumference --      Peak Flow --      Pain Score 07/11/19 1357 0     Pain Loc --      Pain Edu? --      Excl. in [place]? --     Constitutional: Alert and oriented. Well appearing and in no acute distress. Eyes: Conjunctivae are normal. Head: Atraumatic. Nose: No congestion/rhinnorhea. Mouth/Throat: Mucous membranes are moist. Neck: No stridor.  Cardiovascular: Normal rate, regular rhythm. Good peripheral circulation. Respiratory: Normal respiratory effort. Musculoskeletal: Full ROM throughout.  Neurologic:  Normal speech and language. No gross focal neurologic deficits are appreciated. Speech is normal. Skin:  Skin is  warm, dry and intact. No rash noted. Psychiatric: Mood and affect are normal. Speech and behavior are normal.  ____________________________________________   LABS (all labs ordered are listed, but only abnormal results are displayed)  Labs Reviewed  URINALYSIS, COMPLETE (UACMP) WITH MICROSCOPIC   ____________________________________________  EKG  Not indicated. ____________________________________________  RADIOLOGY  Not indicated. ____________________________________________   PROCEDURES  Foley catheter removed by  RN. ____________________________________________   INITIAL IMPRESSION / ASSESSMENT AND PLAN / ED COURSE  According to Dr. Tonette Bihari note, home health was to attempt a trial of removing the Foley catheter.  The patient wants catheter removed today and states that it is too uncomfortable to leave in any longer.  She was advised that the catheter may have to be reinserted if she is developed urinary retention.  She was encouraged to attempt to urinate every couple of hours.  She was encouraged to increase her fluid intake.  If she does develop urinary retention, she is to notify home health or return to the emergency department for reinsertion of Foley. ____________________________________________   FINAL CLINICAL IMPRESSION(S) / ED DIAGNOSES  Final diagnoses:  Encounter for Foley catheter removal       Victorino Dike, FNP 07/11/19 1623    Earleen Newport, MD 07/12/19 [id number]

## 2019-07-11 NOTE — ED Triage Notes (Signed)
Pt in via POV w/ caregiver; states advised per [facility] to come here for urinary catheter removal due to being unable to see doctors at [facility].  Pt denies any complaints at this time.  Vitals WDL.  NAD noted at this time.

## 2019-07-12 ENCOUNTER — Encounter: Payer: Self-pay | Admitting: Oncology

## 2019-07-12 DIAGNOSIS — C8589 Other specified types of non-Hodgkin lymphoma, extranodal and solid organ sites: Secondary | ICD-10-CM | POA: Insufficient documentation

## 2019-07-12 DIAGNOSIS — Z7189 Other specified counseling: Secondary | ICD-10-CM | POA: Insufficient documentation

## 2019-07-12 DIAGNOSIS — C8339 Primary central nervous system lymphoma: Secondary | ICD-10-CM | POA: Insufficient documentation

## 2019-07-12 LAB — HEPATITIS B SURFACE ANTIBODY, QUANTITATIVE: Hep B S AB Quant (Post): 3.8 m[IU]/mL — ABNORMAL LOW (ref >9.9)

## 2019-07-12 LAB — NOVEL CORONAVIRUS, NAA: SARS-CoV-2, NAA: NOT DETECTED

## 2019-07-12 LAB — HEPATITIS C ANTIBODY: HCV Ab: <0.1 s/co ratio — AB (ref 0.0–0.9)

## 2019-07-12 LAB — HEPATITIS B SURFACE ANTIGEN: Hepatitis B Surface Ag: NEGATIVE — AB

## 2019-07-12 NOTE — Progress Notes (Signed)
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  She was admitted to [facility] and underwent brain biopsy by Dr. 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  It was also positive for BCL-2 BCL 6 and MUM 1 as well as c-Myc on IHC.  Ki-67 70%.  FISH studies for double hit lymphoma were pending.  Patient referred for further management.  Prior to this diagnosis patient was living alone and independent of her ADLs and IADLs.  She was independent in driving.  She does not have any children of her own and her nieces are mainly involved in her care.  ECOG PS- 1  Pain scale- 0  Review of systems- Review of Systems  Constitutional: Negative for chills, fever, malaise/fatigue and weight loss.  HENT: Negative for congestion, ear discharge and nosebleeds.   Eyes: Negative for blurred vision.  Respiratory: Negative for cough, hemoptysis, sputum production, shortness of breath and wheezing.   Cardiovascular: Negative for chest pain, palpitations, orthopnea and claudication.  Gastrointestinal: Negative for abdominal pain, blood in stool, constipation, diarrhea, heartburn, melena, nausea and vomiting.  Genitourinary: Negative for dysuria, flank pain, frequency, hematuria and urgency.  Musculoskeletal: Negative for back pain, joint pain and myalgias.  Skin:  Negative for rash.  Neurological: Negative for dizziness, tingling, focal weakness, seizures, weakness and headaches.       Confusion  Endo/Heme/Allergies: Does not bruise/bleed easily.  Psychiatric/Behavioral: Negative for depression and suicidal ideas. The patient does not have insomnia.     No Known Allergies  There are no problems to display for this patient.    Past Medical History:  Diagnosis Date  . 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 Arthritis   . CNS lymphoma ([place])   . Hypertension      History reviewed. No pertinent surgical history.  Social History   Socioeconomic History  . Marital status: Widowed    Spouse name: Not on file  . Number of children: Not on file  . Years of education: Not on file  . Highest education level: Not on file  Occupational History  . Not on file  Tobacco Use  . Smoking status: Never Smoker  . Smokeless tobacco: Never Used  Substance and Sexual Activity  . Alcohol use: Not Currently    Comment: rare beer  . Drug use: Not Currently  . Sexual activity: Not Currently  Other Topics Concern  . Not on file  Social History Narrative  . Not on file   Social Determinants of Health   Financial Resource Strain:   . Difficulty of Paying Living Expenses: Not on file  Food Insecurity:   . Worried About Charity fundraiser in the Last Year: Not on file  . Ran Out of Food in the Last Year: Not on file  Transportation Needs:   . Lack of Transportation (Medical): Not on file  . Lack of Transportation (Non-Medical): Not on file  Physical Activity:   .  Days of Exercise per Week: Not on file  . Minutes of Exercise per Session: Not on file  Stress:   . Feeling of Stress : Not on file  Social Connections:   . Frequency of Communication with Friends and Family: Not on file  . Frequency of Social Gatherings with Friends and Family: Not on file  . Attends Religious Services: Not on file  . Active Member of Clubs or Organizations: Not on file  .  Attends Archivist Meetings: Not on file  . Marital Status: Not on file  Intimate Partner Violence:   . Fear of Current or Ex-Partner: Not on file  . Emotionally Abused: Not on file  . Physically Abused: Not on file  . Sexually Abused: Not on file     Family History  Problem Relation Age of Onset  . Hypertension Sister      Current Outpatient Medications:  .  acetaminophen (TYLENOL 8 HOUR ARTHRITIS PAIN) 650 MG CR tablet, Take 650 mg by mouth every 8 (eight) hours as needed for pain., Disp: , Rfl:  .  dexamethasone (DECADRON) 4 MG tablet, Take 1 tablet (4 mg total) by mouth 2 (two) times daily with a meal., Disp: 28 tablet, Rfl: 0 .  hydrochlorothiazide (HYDRODIURIL) 25 MG tablet, Take 25 mg by mouth daily., Disp: , Rfl:  .  ibuprofen (ADVIL) 200 MG tablet, Take 200-800 mg by mouth every 6 (six) hours as needed for pain., Disp: , Rfl:  .  pantoprazole (PROTONIX) 20 MG tablet, Take 1 tablet (20 mg total) by mouth daily., Disp: 30 tablet, Rfl: 0   Physical exam:  Vitals:   07/11/19 1052  BP: 130/69  Pulse: 82  Resp: 18  Temp: 99.1 F (37.3 C)  TempSrc: Tympanic  SpO2: 98%  Weight: 190 lb (86.2 kg)   Physical Exam HENT:     Head: Normocephalic and atraumatic.  Eyes:     Pupils: Pupils are equal, round, and reactive to light.  Cardiovascular:     Rate and Rhythm: Normal rate and regular rhythm.     Heart sounds: Normal heart sounds.  Pulmonary:     Effort: Pulmonary effort is normal.     Breath sounds: Normal breath sounds.  Abdominal:     General: Bowel sounds are normal.     Palpations: Abdomen is soft.     Comments: No palpable hepatosplenomegaly.  Musculoskeletal:     Cervical back: Normal range of motion.  Lymphadenopathy:     Comments: No palpable cervical, supraclavicular, axillary or inguinal adenopathy   Skin:    General: Skin is warm and dry.  Neurological:     Mental Status: She is alert.     Comments: Oriented to self and place.         CMP Latest Ref Rng & Units 07/11/2019  Glucose 70 - 99 mg/dL 106(H)  BUN 8 - 23 mg/dL 16  Creatinine 0.44 - 1.00 mg/dL 0.63  Sodium 135 - 145 mmol/L 136  Potassium 3.5 - 5.1 mmol/L 4.1  Chloride 98 - 111 mmol/L 101  CO2 22 - 32 mmol/L 26  Calcium 8.9 - 10.3 mg/dL 9.5  Total Protein 6.5 - 8.1 g/dL 7.9  Total Bilirubin 0.3 - 1.2 mg/dL 0.5  Alkaline Phos 38 - 126 U/L 65  AST 15 - 41 U/L 18  ALT 0 - 44 U/L 20   CBC Latest Ref Rng & Units 07/11/2019  WBC 4.0 - 10.5 K/uL 6.3  Hemoglobin 12.0 - 15.0  g/dL 12.2  Hematocrit 36.0 - 46.0 % 40.6  Platelets 150 - 400 K/uL 262    No images are attached to the encounter.  DG Chest 1 View  Result Date: 06/28/2019 CLINICAL DATA:  Altered mental status EXAM: CHEST  1 VIEW COMPARISON:  September 28, 2016 FINDINGS: There is slight bibasilar atelectasis. There is stable elevation of the right hemidiaphragm. Lungs elsewhere are clear. Heart is upper normal in size with pulmonary vascularity normal. No adenopathy. There is aortic atherosclerosis. Bones are osteoporotic. There is arthropathy in both shoulders. IMPRESSION: Stable elevation the right hemidiaphragm. Slight bibasilar atelectasis. Lungs elsewhere clear. Heart upper normal in size. Bones osteoporotic. Aortic Atherosclerosis (ICD10-I70.0). Electronically Signed   By: Lowella Grip III M.D.   On: 06/28/2019 12:18   CT Head Wo Contrast  Result Date: 06/28/2019 CLINICAL DATA:  Increasing confusion and disorientation. EXAM: CT HEAD WITHOUT CONTRAST TECHNIQUE: Contiguous axial images were obtained from the base of the skull through the vertex without intravenous contrast. COMPARISON:  Brain MRI 08/03/2018 FINDINGS: Brain: Large central lobulated area of hyperdensity surrounding the frontal horns of both lateral ventricles, crossing the midline and involving the caudate regions. There is significant surrounding vasogenic edema. Although this could reflect an unusual manifestation of hemorrhage (given  history of prior aneurysm nearby) I think this is much more likely CNS lymphoma or possibly a glioma. Recommend MRI brain without and with contrast for further evaluation. No evidence of infarction. The edema pattern is causing some obscuration of the sulci. No downward transtentorial herniation. The brainstem and cerebellum are grossly normal. Vascular: Vascular calcifications. 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 Skull: No skull fracture or bone lesion. Prior right craniostomy site noted in the fundal area. Sinuses/Orbits: The paranasal sinuses and mastoid air cells are clear. Other: No scalp lesions or hematoma. IMPRESSION: 1. Lobulated area of hyperdensity in the central cerebrum surrounding the frontal horns, in the caudate area and crossing the midline. 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 Glioma would be a possibility and much less likely this could be unusual manifestation of hemorrhage. Recommend MRI brain without and with contrast for further evaluation. 2. 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 Electronically Signed   By: Marijo Sanes M.D.   On: 06/28/2019 12:22   MR Brain W and Wo Contrast  Result Date: 06/28/2019 CLINICAL DATA:  Brain mass. Abnormal CT today. History of brain aneurysm. Altered mental status. EXAM: MRI HEAD WITHOUT AND WITH CONTRAST TECHNIQUE: Multiplanar, multiecho pulse sequences of the brain and surrounding structures were obtained without and with intravenous contrast. CONTRAST:  52m GADAVIST GADOBUTROL 1 MMOL/ML IV SOLN COMPARISON:  CT head 06/28/2019 FINDINGS: Brain: Densely enhancing mass lesion in the region of the frontal horns of the lateral ventricles bilaterally. This appears to be ependymal in location with intraventricular extension especially on the right. The right frontal enhancing mass measures approximately 22 x 37 mm. The left frontal mass measures approximately 14 x 31 mm. There is extensive surrounding white matter edema. Ventricles are mildly enlarged. The  enhancing mass shows restricted diffusion. Prior ventricular shunt catheter through the right frontal lobe into the right frontal horn. Prior coiling of anterior communicating artery aneurysm. Vascular: Normal flow voids in the arteries of the base of brain. 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 Skull and upper cervical spine: No acute skeletal lesion. Sinuses/Orbits: Negative Other: None IMPRESSION: Bifrontal enhancing mass lesions extending into both frontal lobes. These show restricted diffusion, dense enhancement and hyperdensity on CT. 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 There is mild ventricular enlargement without midline shift. Electronically Signed   By: Franchot Gallo M.D.   On: 06/28/2019 19:03    Assessment and plan- Patient is a 85 y.o. female referred for new diagnosis of CNS lymphoma  I discussed with the patient and her niece about further steps.  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  She will need a PET CT scan to rule out systemic involvement.  She would also need ophthalmology evaluation to rule out ocular involvement as well as a lumbar puncture.  Ultimately primary CNS lymphoma is a high-grade lymphoma which needs treatment with high-dose methotrexate which is highly effective and has been studied even in the elderly population.  Is typically combined with Rituxan plus or minus Temodar.  There are other regimens with high-dose methotrexate as well which could also be considered.  However we do not do high-dose methotrexate here at [facility] as an inpatient and I would recommend that she should get this treatment at a tertiary center at [facility] as it would require closer monitoring especially given her age.  If she needs any blood work or blood transfusions in the future I would be happy to coordinate that with [facility] but she would need to be seen by [facility] lymphoma specialist ASAP.  Treatment will be given with a curative  intent Given that she had significant edema surrounding her brain lesions and we already obtain tissue diagnosis I will go ahead and start her on Decadron 4 mg twice daily along with Protonix until she is seen by [facility].  I will also get baseline labs today including CBC with differential, CMP, uric acid and LDH as well as hepatitis B and C testing to expedite her treatment at [facility] to potentially start Rituxan   Total face to face encounter time for this patient visit was 30 min.  Total non face to face encounter time for this patient on the day of the visit was 10 min. Time spent in reviewing outside records as well as discussing patients case with DR. Lacinda Axon. Total time 50 min    Thank you for this kind referral and the opportunity to participate in the care of this patient   Visit Diagnosis 1. Primary CNS lymphoma ([place])   2. Goals of care, counseling/discussion     Dr. Randa Evens, MD, MPH [facility] [phone] 07/12/2019  12:26 PM

## 2019-07-21 ENCOUNTER — Encounter: Payer: Self-pay | Admitting: Oncology

## 2019-07-21 NOTE — Telephone Encounter (Signed)
She was seen by med onc lymphoma today. She needs to be discussing all her needs with them here on

## 2019-08-03 ENCOUNTER — Other Ambulatory Visit: Payer: Self-pay | Admitting: Oncology

## 2019-10-10 ENCOUNTER — Encounter: Payer: Self-pay | Admitting: *Deleted

## 2019-10-10 ENCOUNTER — Other Ambulatory Visit: Payer: Self-pay

## 2019-10-10 ENCOUNTER — Emergency Department
Admission: EM | Admit: 2019-10-10 | Discharge: 2019-10-11 | Disposition: A | Discharge status: Home / Self Care | Payer: Medicare PPO | Attending: Emergency Medicine | Admitting: Emergency Medicine

## 2019-10-10 ENCOUNTER — Emergency Department: Payer: Medicare PPO

## 2019-10-10 DIAGNOSIS — Z20822 Contact with and (suspected) exposure to covid-19: Secondary | ICD-10-CM | POA: Insufficient documentation

## 2019-10-10 DIAGNOSIS — R531 Weakness: Secondary | ICD-10-CM | POA: Diagnosis present

## 2019-10-10 DIAGNOSIS — I10 Essential (primary) hypertension: Secondary | ICD-10-CM | POA: Insufficient documentation

## 2019-10-10 DIAGNOSIS — C8389 Other non-follicular lymphoma, extranodal and solid organ sites: Secondary | ICD-10-CM | POA: Diagnosis not present

## 2019-10-10 DIAGNOSIS — Z79899 Other long term (current) drug therapy: Secondary | ICD-10-CM | POA: Diagnosis not present

## 2019-10-10 LAB — CBC
HCT: 34.3 % — ABNORMAL LOW (ref 36.0–46.0)
Hemoglobin: 10.9 g/dL — ABNORMAL LOW (ref 12.0–15.0)
MCH: 28.2 pg (ref 26.0–34.0)
MCHC: 31.8 g/dL (ref 30.0–36.0)
MCV: 88.6 fL (ref 80.0–100.0)
Platelets: 237 10*3/uL (ref 150–400)
RBC: 3.87 MIL/uL (ref 3.87–5.11)
RDW: 15.5 % (ref 11.5–15.5)
WBC: 5.3 10*3/uL (ref 4.0–10.5)
nRBC: 0 % (ref 0.0–0.2)

## 2019-10-10 LAB — BASIC METABOLIC PANEL
Anion gap: 12 (ref 5–15)
BUN: 9 mg/dL (ref 8–23)
CO2: 25 mmol/L (ref 22–32)
Calcium: 8.8 mg/dL — ABNORMAL LOW (ref 8.9–10.3)
Chloride: 100 mmol/L (ref 98–111)
Creatinine, Ser: 0.52 mg/dL (ref 0.44–1.00)
GFR calc Af Amer: >60 mL/min (ref >60)
GFR calc non Af Amer: >60 mL/min (ref >60)
Glucose, Bld: 122 mg/dL — ABNORMAL HIGH (ref 70–99)
Potassium: 3.2 mmol/L — ABNORMAL LOW (ref 3.5–5.1)
Sodium: 137 mmol/L (ref 135–145)

## 2019-10-10 LAB — TROPONIN I (HIGH SENSITIVITY): Troponin I (High Sensitivity): 50 ng/L — ABNORMAL HIGH (ref <18)

## 2019-10-10 MED ORDER — SODIUM CHLORIDE 0.9% FLUSH: 3.0000 mL | Freq: Once | INTRAVENOUS | Status: DC

## 2019-10-10 MED ORDER — SODIUM CHLORIDE 0.9 % IV BOLUS
1000.0000 mL | Freq: Once | INTRAVENOUS | Status: AC
  Administered 2019-10-10: 1000 mL via INTRAVENOUS

## 2019-10-10 NOTE — ED Triage Notes (Addendum)
Pt to triage via wheelchair.  Pt reports feeling weak.  Denies n/v/d.  Pt reports sob with exertion.  Pt has a cough.  Pt alert. Hx brain cancer  Last chemo 3 weeks ago

## 2019-10-11 LAB — URINALYSIS, COMPLETE (UACMP) WITH MICROSCOPIC
Bacteria, UA: NONE SEEN
Bilirubin Urine: NEGATIVE
Glucose, UA: NEGATIVE mg/dL
Hgb urine dipstick: NEGATIVE
Ketones, ur: NEGATIVE mg/dL
Leukocytes,Ua: NEGATIVE
Nitrite: NEGATIVE
Protein, ur: NEGATIVE mg/dL
Specific Gravity, Urine: 1.005 (ref 1.005–1.030)
pH: 6 (ref 5.0–8.0)

## 2019-10-11 LAB — POC SARS CORONAVIRUS 2 AG: SARS Coronavirus 2 Ag: NEGATIVE

## 2019-10-11 LAB — TROPONIN I (HIGH SENSITIVITY): Troponin I (High Sensitivity): 54 ng/L — ABNORMAL HIGH (ref <18)

## 2019-10-11 LAB — BRAIN NATRIURETIC PEPTIDE: B Natriuretic Peptide: 68 pg/mL (ref 0.0–100.0)

## 2019-10-11 MED ORDER — POTASSIUM CHLORIDE CRYS ER 20 MEQ PO TBCR
40.0000 meq | EXTENDED_RELEASE_TABLET | Freq: Once | ORAL | Status: AC
  Administered 2019-10-11: 01:00:00 40 meq via ORAL
  Filled 2019-10-11: qty 2

## 2019-10-11 MED ORDER — ONDANSETRON 4 MG PO TBDP: 4.0000 mg | ORAL_TABLET | Freq: Three times a day (TID) | ORAL | 0 refills | Status: DC | PRN

## 2019-10-11 NOTE — ED Provider Notes (Addendum)
[facility] Emergency Department Provider Note  ____________________________________________   First MD Initiated Contact with Patient 10/10/19 2328     (approximate)  I have reviewed the triage vital signs and the nursing notes.   HISTORY  Chief Complaint Weakness    HPI Sheila Krueger is a 85 y.o. female with below list of previous medical conditions including CNS lymphoma for which the patient has received chemotherapy last treatment April with plan to start oral chemotherapy this Friday presents to the emergency department via her knee secondary to decreased p.o. intake today.  Patient states that she recently returned from a trip in [place] and that she just had "stomach upset".  Patient denies any cough no fever.  Patient denies any vomiting or diarrhea.  Patient actually states that she is hungry at this time.  Patient denies any headache no nausea.        Past Medical History:  Diagnosis Date  . 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 Arthritis   . CNS lymphoma ([place])   . Hypertension     Patient Active Problem List   Diagnosis Date Noted  . Goals of care, counseling/discussion 07/12/2019  . Primary CNS lymphoma ([employer]) 07/12/2019    No past surgical history on file.  Prior to Admission medications   Medication Sig Start Date End Date Taking? Authorizing Provider  acetaminophen (TYLENOL 8 HOUR ARTHRITIS PAIN) 650 MG CR tablet Take 650 mg by mouth every 8 (eight) hours as needed for pain.    [provider]  dexamethasone (DECADRON) 4 MG tablet Take 1 tablet (4 mg total) by mouth 2 (two) times daily with a meal. 07/11/19   Sindy Guadeloupe, MD  hydrochlorothiazide (HYDRODIURIL) 25 MG tablet Take 25 mg by mouth daily. 02/10/19   [provider]  ibuprofen (ADVIL) 200 MG tablet Take 200-800 mg by mouth every 6 (six) hours as needed for pain.    [provider]  pantoprazole (PROTONIX) 20 MG tablet Take 1 tablet (20 mg total)  by mouth daily. 07/11/19   Sindy Guadeloupe, MD    Allergies Patient has no known allergies.  Family History  Problem Relation Age of Onset  . Hypertension Sister     Social History Social History   Tobacco Use  . Smoking status: Never Smoker  . Smokeless tobacco: Never Used  Substance Use Topics  . Alcohol use: Not Currently    Comment: rare beer  . Drug use: Not Currently    Review of Systems Constitutional: No fever/chills Eyes: No visual changes. ENT: No sore throat. Cardiovascular: Denies chest pain. Respiratory: Denies shortness of breath. Gastrointestinal: No abdominal pain.  No nausea, no vomiting.  No diarrhea.  No constipation. Genitourinary: Negative for dysuria. Musculoskeletal: Negative for neck pain.  Negative for back pain. Integumentary: Negative for rash. Neurological: Negative for headaches, focal weakness or numbness.  ____________________________________________   PHYSICAL EXAM:  VITAL SIGNS: ED Triage Vitals  Enc Vitals Group     BP 10/10/19 2115 139/60     Pulse Rate 10/10/19 2115 95     Resp 10/10/19 2115 20     Temp 10/10/19 2115 99 F (37.2 C)     Temp Source 10/10/19 2115 Oral     SpO2 10/10/19 2115 98 %     Weight 10/10/19 2115 78 kg (172 lb)     Height 10/10/19 2115 1.575 m (5\' 2" )     Head Circumference --      Peak  Flow --      Pain Score 10/10/19 2114 0     Pain Loc --      Pain Edu? --      Excl. in [place]? --     Constitutional: Alert and oriented.  Eyes: Conjunctivae are normal.  Mouth/Throat: Patient is wearing a mask. Neck: No stridor.  No meningeal signs.   Cardiovascular: Normal rate, regular rhythm. Good peripheral circulation. Grossly normal heart sounds. Respiratory: Normal respiratory effort.  No retractions. Gastrointestinal: Soft and nontender. No distention.  Musculoskeletal: No lower extremity tenderness nor edema. No gross deformities of extremities. Neurologic:  Normal speech and language. No gross focal  neurologic deficits are appreciated.  Skin:  Skin is warm, dry and intact. Psychiatric: Mood and affect are normal. Speech and behavior are normal.  ____________________________________________   LABS (all labs ordered are listed, but only abnormal results are displayed)  Labs Reviewed  BASIC METABOLIC PANEL - Abnormal; Notable for the following components:      Result Value   Potassium 3.2 (*)    Glucose, Bld 122 (*)    Calcium 8.8 (*)    All other components within normal limits  CBC - Abnormal; Notable for the following components:   Hemoglobin 10.9 (*)    HCT 34.3 (*)    All other components within normal limits  URINALYSIS, COMPLETE (UACMP) WITH MICROSCOPIC - Abnormal; Notable for the following components:   Color, Urine STRAW (*)    APPearance CLEAR (*)    All other components within normal limits  TROPONIN I (HIGH SENSITIVITY) - Abnormal; Notable for the following components:   Troponin I (High Sensitivity) 50 (*)    All other components within normal limits  TROPONIN I (HIGH SENSITIVITY) - Abnormal; Notable for the following components:   Troponin I (High Sensitivity) 54 (*)    All other components within normal limits  BRAIN NATRIURETIC PEPTIDE   ____________________________________________  EKG  ED ECG REPORT I, [place] N Michela Herst, the attending physician, personally viewed and interpreted this ECG.   Date: 10/11/2019  EKG Time: 9:17 PM  Rate: 94  Rhythm: Normal sinus rhythm  Axis: Normal  Intervals: Normal  ST&T Change: None  ____________________________________________  RADIOLOGY I, [place] Posey Jasmin, personally viewed and evaluated these images (plain radiographs) as part of my medical decision making, as well as reviewing the written report by the radiologist.  ED MD interpretation: Chronic right hemidiaphragm elevation with minimal basilar atelectasis on chest x-ray per radiologist.  Official radiology report(s): DG Chest 2 View  Result Date:  10/10/2019 CLINICAL DATA:  Weakness, shortness of breath, and cough. History of CNS lymphoma. EXAM: CHEST - 2 VIEW COMPARISON:  06/28/2019 FINDINGS: The cardiomediastinal silhouette is within normal limits. The lungs are better inflated than on the prior study with chronic right hemidiaphragm elevation again noted. Minimal atelectasis is noted in the right lung base. No confluent airspace opacity, pleural effusion, or pneumothorax is identified. Degenerative changes are noted at the right shoulder. There is mild thoracolumbar scoliosis. Aortic atherosclerosis is noted. IMPRESSION: Chronic right hemidiaphragm elevation with minimal right basilar atelectasis. No evidence of pneumonia or edema. Electronically Signed   By: Logan Bores M.D.   On: 10/10/2019 21:48     Procedures   ____________________________________________   INITIAL IMPRESSION / MDM / ASSESSMENT AND PLAN / ED COURSE  As part of my medical decision making, I reviewed the following data within the [place] NUMBER   85 year old female presented with above-stated history and physical  exam with differential diagnosis including gastroenteritis dehydration, nausea secondary to chemo.  Patient stated during my evaluation that she was actually hungry and as such was given something to eat which she ate without any difficulty.  Patient denies any complaints at present.  Laboratory data revealed a persistently elevated troponin better than before today patient's troponin 50 and 54 in comparison to 128 on June 28, 2019.  Patient denies any chest pain no shortness of breath.  Patient denies any palpitations.  Patient also given 1 L IV normal saline.  Patient has an appointment today with Dr. Ouida Sills primary care provider  ____________________________________________  FINAL CLINICAL IMPRESSION(S) / ED DIAGNOSES  Final diagnoses:  Weakness     MEDICATIONS GIVEN DURING THIS VISIT:  Medications  sodium chloride flush (NS) 0.9  % injection 3 mL (3 mLs Intravenous Not Given 10/10/19 2314)  sodium chloride 0.9 % bolus 1,000 mL (1,000 mLs Intravenous New Bag/Given 10/10/19 2345)  potassium chloride SA (KLOR-CON) CR tablet 40 mEq (40 mEq Oral Given 10/11/19 0109)     ED Discharge Orders    None      *Please note:  Lima Joie Fakhoury was evaluated in Emergency Department on 10/11/2019 for the symptoms described in the history of present illness. She was evaluated in the context of the global COVID-19 pandemic, which necessitated consideration that the patient might be at risk for infection with the SARS-CoV-2 virus that causes COVID-19. Institutional protocols and algorithms that pertain to the evaluation of patients at risk for COVID-19 are in a state of rapid change based on information released by regulatory bodies including the CDC and federal and state organizations. These policies and algorithms were followed during the patient's care in the ED.  Some ED evaluations and interventions may be delayed as a result of limited staffing during the pandemic.*  Note:  This document was prepared using Dragon voice recognition software and may include unintentional dictation errors.   Gregor Hams, MD 10/11/19 [id number]    Gregor Hams, MD 10/11/19 [phone]

## 2019-10-11 NOTE — ED Notes (Signed)
Pt ambulated to toilet w minimal assistance. Pt has steady gait, NAD noted.

## 2019-10-31 ENCOUNTER — Other Ambulatory Visit
Admission: RE | Admit: 2019-10-31 | Discharge: 2019-10-31 | Disposition: A | Discharge status: Home / Self Care | Payer: Medicare PPO | Attending: Internal Medicine | Admitting: Internal Medicine

## 2019-10-31 DIAGNOSIS — R600 Localized edema: Secondary | ICD-10-CM | POA: Diagnosis present

## 2019-10-31 LAB — FIBRIN DERIVATIVES D-DIMER (ARMC ONLY): Fibrin derivatives D-dimer (ARMC): 1739.79 ng/mL (FEU) — ABNORMAL HIGH (ref 0.00–499.00)

## 2019-11-01 ENCOUNTER — Ambulatory Visit: Admission: RE | Admit: 2019-11-01 | Payer: Medicare PPO | Source: Ambulatory Visit

## 2019-11-01 ENCOUNTER — Other Ambulatory Visit: Payer: Self-pay | Admitting: Internal Medicine

## 2019-11-01 DIAGNOSIS — R600 Localized edema: Secondary | ICD-10-CM

## 2019-11-01 DIAGNOSIS — R7989 Other specified abnormal findings of blood chemistry: Secondary | ICD-10-CM

## 2019-11-02 ENCOUNTER — Ambulatory Visit
Admission: RE | Admit: 2019-11-02 | Discharge: 2019-11-02 | Disposition: A | Discharge status: Home / Self Care | Payer: Medicare PPO | Source: Ambulatory Visit | Attending: Internal Medicine | Admitting: Internal Medicine

## 2019-11-02 ENCOUNTER — Other Ambulatory Visit: Payer: Self-pay

## 2019-11-02 DIAGNOSIS — R7989 Other specified abnormal findings of blood chemistry: Secondary | ICD-10-CM | POA: Insufficient documentation

## 2019-11-02 DIAGNOSIS — R600 Localized edema: Secondary | ICD-10-CM

## 2019-12-06 ENCOUNTER — Ambulatory Visit: Payer: Medicare PPO | Admitting: Physical Therapy

## 2019-12-08 ENCOUNTER — Ambulatory Visit: Payer: Medicare PPO | Admitting: Physical Therapy

## 2019-12-13 ENCOUNTER — Ambulatory Visit: Payer: Medicare PPO | Admitting: Physical Therapy

## 2019-12-15 ENCOUNTER — Ambulatory Visit: Payer: Medicare PPO | Admitting: Physical Therapy

## 2019-12-19 ENCOUNTER — Ambulatory Visit: Payer: Medicare PPO | Admitting: Physical Therapy

## 2019-12-21 ENCOUNTER — Ambulatory Visit: Payer: Medicare PPO | Admitting: Physical Therapy

## 2019-12-26 ENCOUNTER — Ambulatory Visit: Payer: Medicare PPO | Admitting: Physical Therapy

## 2019-12-28 ENCOUNTER — Ambulatory Visit: Payer: Medicare PPO | Admitting: Physical Therapy

## 2020-01-02 ENCOUNTER — Ambulatory Visit: Payer: Medicare PPO | Admitting: Physical Therapy

## 2020-01-09 ENCOUNTER — Ambulatory Visit: Payer: Medicare PPO | Admitting: Physical Therapy

## 2020-01-11 ENCOUNTER — Ambulatory Visit: Payer: Medicare PPO | Admitting: Physical Therapy

## 2020-01-16 ENCOUNTER — Ambulatory Visit: Payer: Medicare PPO | Admitting: Physical Therapy

## 2020-01-18 ENCOUNTER — Ambulatory Visit: Payer: Medicare PPO | Admitting: Physical Therapy

## 2020-01-23 ENCOUNTER — Ambulatory Visit: Payer: Medicare PPO | Admitting: Physical Therapy

## 2020-01-25 ENCOUNTER — Ambulatory Visit: Payer: Medicare PPO | Admitting: Physical Therapy

## 2020-01-30 ENCOUNTER — Ambulatory Visit: Payer: Medicare PPO | Admitting: Physical Therapy

## 2020-02-01 ENCOUNTER — Ambulatory Visit: Payer: Medicare PPO | Admitting: Physical Therapy

## 2020-02-06 ENCOUNTER — Ambulatory Visit: Payer: Medicare PPO | Admitting: Physical Therapy

## 2020-02-08 ENCOUNTER — Ambulatory Visit: Payer: Medicare PPO | Admitting: Physical Therapy

## 2020-02-10 ENCOUNTER — Emergency Department: Payer: Medicare PPO

## 2020-02-10 ENCOUNTER — Other Ambulatory Visit: Payer: Self-pay

## 2020-02-10 DIAGNOSIS — M1990 Unspecified osteoarthritis, unspecified site: Secondary | ICD-10-CM | POA: Diagnosis present

## 2020-02-10 DIAGNOSIS — Z8249 Family history of ischemic heart disease and other diseases of the circulatory system: Secondary | ICD-10-CM

## 2020-02-10 DIAGNOSIS — E785 Hyperlipidemia, unspecified: Secondary | ICD-10-CM | POA: Diagnosis present

## 2020-02-10 DIAGNOSIS — U071 COVID-19: Principal | ICD-10-CM | POA: Diagnosis present

## 2020-02-10 DIAGNOSIS — I10 Essential (primary) hypertension: Secondary | ICD-10-CM | POA: Diagnosis present

## 2020-02-10 DIAGNOSIS — Z886 Allergy status to analgesic agent status: Secondary | ICD-10-CM

## 2020-02-10 DIAGNOSIS — Z79899 Other long term (current) drug therapy: Secondary | ICD-10-CM

## 2020-02-10 DIAGNOSIS — Z79890 Hormone replacement therapy (postmenopausal): Secondary | ICD-10-CM

## 2020-02-10 DIAGNOSIS — J1282 Pneumonia due to coronavirus disease 2019: Secondary | ICD-10-CM | POA: Diagnosis present

## 2020-02-10 DIAGNOSIS — R05 Cough: Secondary | ICD-10-CM | POA: Diagnosis not present

## 2020-02-10 DIAGNOSIS — I248 Other forms of acute ischemic heart disease: Secondary | ICD-10-CM | POA: Diagnosis present

## 2020-02-10 DIAGNOSIS — C8580 Other specified types of non-Hodgkin lymphoma, unspecified site: Secondary | ICD-10-CM | POA: Diagnosis present

## 2020-02-10 LAB — COMPREHENSIVE METABOLIC PANEL
ALT: 17 U/L (ref 0–44)
AST: 23 U/L (ref 15–41)
Albumin: 4.4 g/dL (ref 3.5–5.0)
Alkaline Phosphatase: 81 U/L (ref 38–126)
Anion gap: 12 (ref 5–15)
BUN: 13 mg/dL (ref 8–23)
CO2: 24 mmol/L (ref 22–32)
Calcium: 9.3 mg/dL (ref 8.9–10.3)
Chloride: 102 mmol/L (ref 98–111)
Creatinine, Ser: 0.66 mg/dL (ref 0.44–1.00)
GFR calc Af Amer: >60 mL/min (ref >60)
GFR calc non Af Amer: >60 mL/min (ref >60)
Glucose, Bld: 115 mg/dL — ABNORMAL HIGH (ref 70–99)
Potassium: 3.7 mmol/L (ref 3.5–5.1)
Sodium: 138 mmol/L (ref 135–145)
Total Bilirubin: 0.7 mg/dL (ref 0.3–1.2)
Total Protein: 7.7 g/dL (ref 6.5–8.1)

## 2020-02-10 LAB — CBC
HCT: 37.2 % (ref 36.0–46.0)
Hemoglobin: 11.6 g/dL — ABNORMAL LOW (ref 12.0–15.0)
MCH: 26.9 pg (ref 26.0–34.0)
MCHC: 31.2 g/dL (ref 30.0–36.0)
MCV: 86.3 fL (ref 80.0–100.0)
Platelets: 202 10*3/uL (ref 150–400)
RBC: 4.31 MIL/uL (ref 3.87–5.11)
RDW: 14.7 % (ref 11.5–15.5)
WBC: 4 10*3/uL (ref 4.0–10.5)
nRBC: 0 % (ref 0.0–0.2)

## 2020-02-10 LAB — TROPONIN I (HIGH SENSITIVITY): Troponin I (High Sensitivity): 64 ng/L — ABNORMAL HIGH (ref <18)

## 2020-02-10 MED ORDER — ACETAMINOPHEN 500 MG PO TABS
1000.0000 mg | ORAL_TABLET | Freq: Once | ORAL | Status: AC
  Administered 2020-02-11: 1000 mg via ORAL
  Filled 2020-02-10: qty 2

## 2020-02-10 NOTE — ED Triage Notes (Signed)
Pt reports she tested positive for COVID today, reports has been feeling short of breath for 2 days reports this evening shortness of breath increases, denies any chest pain, reports has cough, and feels congested on her chest, Pt talks in complete sentences no distress noted

## 2020-02-11 ENCOUNTER — Emergency Department: Payer: Medicare PPO

## 2020-02-11 ENCOUNTER — Inpatient Hospital Stay
Admission: EM | Admit: 2020-02-11 | Discharge: 2020-02-15 | DRG: 177 | Disposition: A | Discharge status: Home / Self Care | Payer: Medicare PPO | Source: Ambulatory Visit | Attending: Internal Medicine | Admitting: Internal Medicine

## 2020-02-11 DIAGNOSIS — R0600 Dyspnea, unspecified: Secondary | ICD-10-CM | POA: Diagnosis not present

## 2020-02-11 DIAGNOSIS — M1990 Unspecified osteoarthritis, unspecified site: Secondary | ICD-10-CM | POA: Diagnosis present

## 2020-02-11 DIAGNOSIS — C8589 Other specified types of non-Hodgkin lymphoma, extranodal and solid organ sites: Secondary | ICD-10-CM

## 2020-02-11 DIAGNOSIS — I340 Nonrheumatic mitral (valve) insufficiency: Secondary | ICD-10-CM | POA: Diagnosis not present

## 2020-02-11 DIAGNOSIS — R0602 Shortness of breath: Secondary | ICD-10-CM

## 2020-02-11 DIAGNOSIS — C8339 Primary central nervous system lymphoma: Secondary | ICD-10-CM

## 2020-02-11 DIAGNOSIS — R778 Other specified abnormalities of plasma proteins: Secondary | ICD-10-CM | POA: Diagnosis present

## 2020-02-11 DIAGNOSIS — R062 Wheezing: Secondary | ICD-10-CM

## 2020-02-11 DIAGNOSIS — R059 Cough, unspecified: Secondary | ICD-10-CM

## 2020-02-11 DIAGNOSIS — Z79899 Other long term (current) drug therapy: Secondary | ICD-10-CM | POA: Diagnosis not present

## 2020-02-11 DIAGNOSIS — R509 Fever, unspecified: Secondary | ICD-10-CM

## 2020-02-11 DIAGNOSIS — J1282 Pneumonia due to coronavirus disease 2019: Secondary | ICD-10-CM | POA: Diagnosis present

## 2020-02-11 DIAGNOSIS — R05 Cough: Secondary | ICD-10-CM | POA: Diagnosis present

## 2020-02-11 DIAGNOSIS — R7989 Other specified abnormal findings of blood chemistry: Secondary | ICD-10-CM | POA: Diagnosis present

## 2020-02-11 DIAGNOSIS — E785 Hyperlipidemia, unspecified: Secondary | ICD-10-CM | POA: Diagnosis present

## 2020-02-11 DIAGNOSIS — I10 Essential (primary) hypertension: Secondary | ICD-10-CM

## 2020-02-11 DIAGNOSIS — C8580 Other specified types of non-Hodgkin lymphoma, unspecified site: Secondary | ICD-10-CM | POA: Diagnosis present

## 2020-02-11 DIAGNOSIS — Z79890 Hormone replacement therapy (postmenopausal): Secondary | ICD-10-CM | POA: Diagnosis not present

## 2020-02-11 DIAGNOSIS — Z886 Allergy status to analgesic agent status: Secondary | ICD-10-CM | POA: Diagnosis not present

## 2020-02-11 DIAGNOSIS — Z8249 Family history of ischemic heart disease and other diseases of the circulatory system: Secondary | ICD-10-CM | POA: Diagnosis not present

## 2020-02-11 DIAGNOSIS — U071 COVID-19: Secondary | ICD-10-CM | POA: Diagnosis present

## 2020-02-11 DIAGNOSIS — I361 Nonrheumatic tricuspid (valve) insufficiency: Secondary | ICD-10-CM | POA: Diagnosis not present

## 2020-02-11 DIAGNOSIS — I248 Other forms of acute ischemic heart disease: Secondary | ICD-10-CM | POA: Diagnosis present

## 2020-02-11 LAB — HEPATITIS B SURFACE ANTIGEN: Hepatitis B Surface Ag: NONREACTIVE

## 2020-02-11 LAB — FIBRINOGEN: Fibrinogen: 455 mg/dL (ref 210–475)

## 2020-02-11 LAB — TROPONIN I (HIGH SENSITIVITY)
Troponin I (High Sensitivity): 82 ng/L — ABNORMAL HIGH (ref <18)
Troponin I (High Sensitivity): 77 ng/L — ABNORMAL HIGH (ref <18)
Troponin I (High Sensitivity): 65 ng/L — ABNORMAL HIGH (ref <18)

## 2020-02-11 LAB — PROCALCITONIN: Procalcitonin: <0.1 ng/mL

## 2020-02-11 LAB — BRAIN NATRIURETIC PEPTIDE: B Natriuretic Peptide: 293.1 pg/mL — ABNORMAL HIGH (ref 0.0–100.0)

## 2020-02-11 LAB — FIBRIN DERIVATIVES D-DIMER (ARMC ONLY): Fibrin derivatives D-dimer (ARMC): 2125.42 ng/mL (FEU) — ABNORMAL HIGH (ref 0.00–499.00)

## 2020-02-11 LAB — FERRITIN: Ferritin: 47 ng/mL (ref 11–307)

## 2020-02-11 LAB — C-REACTIVE PROTEIN: CRP: 0.5 mg/dL (ref <1.0)

## 2020-02-11 LAB — LACTATE DEHYDROGENASE: LDH: 206 U/L — ABNORMAL HIGH (ref 98–192)

## 2020-02-11 MED ORDER — ZINC SULFATE 220 (50 ZN) MG PO CAPS
220.0000 mg | ORAL_CAPSULE | Freq: Every day | ORAL | Status: DC
  Administered 2020-02-11 – 2020-02-15 (×5): 220 mg via ORAL
  Filled 2020-02-11 (×5): qty 1

## 2020-02-11 MED ORDER — ACETAMINOPHEN 325 MG PO TABS: 650.0000 mg | ORAL_TABLET | Freq: Four times a day (QID) | ORAL | Status: DC | PRN

## 2020-02-11 MED ORDER — ASPIRIN 81 MG PO CHEW
324.0000 mg | CHEWABLE_TABLET | Freq: Once | ORAL | Status: AC
  Administered 2020-02-11: 324 mg via ORAL
  Filled 2020-02-11: qty 4

## 2020-02-11 MED ORDER — ONDANSETRON HCL 4 MG PO TABS: 4.0000 mg | ORAL_TABLET | Freq: Four times a day (QID) | ORAL | Status: DC | PRN

## 2020-02-11 MED ORDER — PANTOPRAZOLE SODIUM 20 MG PO TBEC
20.0000 mg | DELAYED_RELEASE_TABLET | Freq: Every day | ORAL | Status: DC
  Administered 2020-02-11 – 2020-02-15 (×4): 20 mg via ORAL
  Filled 2020-02-11 (×6): qty 1

## 2020-02-11 MED ORDER — ASCORBIC ACID 500 MG PO TABS
500.0000 mg | ORAL_TABLET | Freq: Every day | ORAL | Status: DC
  Administered 2020-02-11 – 2020-02-15 (×5): 500 mg via ORAL
  Filled 2020-02-11 (×5): qty 1

## 2020-02-11 MED ORDER — SODIUM CHLORIDE 0.9 % IV SOLN
100.0000 mg | Freq: Every day | INTRAVENOUS | Status: AC
  Administered 2020-02-12 – 2020-02-15 (×4): 100 mg via INTRAVENOUS
  Filled 2020-02-11: qty 20
  Filled 2020-02-11: qty 100
  Filled 2020-02-11 (×3): qty 20

## 2020-02-11 MED ORDER — ENOXAPARIN SODIUM 40 MG/0.4ML ~~LOC~~ SOLN
40.0000 mg | SUBCUTANEOUS | Status: DC
  Administered 2020-02-11 – 2020-02-15 (×5): 40 mg via SUBCUTANEOUS
  Filled 2020-02-11 (×5): qty 0.4

## 2020-02-11 MED ORDER — SODIUM CHLORIDE 0.9 % IV SOLN
200.0000 mg | Freq: Once | INTRAVENOUS | Status: AC
  Administered 2020-02-11: 200 mg via INTRAVENOUS
  Filled 2020-02-11: qty 40

## 2020-02-11 MED ORDER — FERROUS SULFATE 325 (65 FE) MG PO TABS
325.0000 mg | ORAL_TABLET | Freq: Every morning | ORAL | Status: DC
  Administered 2020-02-11 – 2020-02-15 (×4): 325 mg via ORAL
  Filled 2020-02-11 (×5): qty 1

## 2020-02-11 MED ORDER — GUAIFENESIN-DM 100-10 MG/5ML PO SYRP
10.0000 mL | ORAL_SOLUTION | ORAL | Status: DC | PRN
  Filled 2020-02-11: qty 10

## 2020-02-11 MED ORDER — ROSUVASTATIN CALCIUM 10 MG PO TABS
10.0000 mg | ORAL_TABLET | Freq: Every day | ORAL | Status: DC
  Administered 2020-02-11 – 2020-02-15 (×4): 10 mg via ORAL
  Filled 2020-02-11 (×5): qty 1

## 2020-02-11 MED ORDER — ASPIRIN EC 81 MG PO TBEC
81.0000 mg | DELAYED_RELEASE_TABLET | Freq: Every day | ORAL | Status: DC
  Administered 2020-02-11 – 2020-02-15 (×5): 81 mg via ORAL
  Filled 2020-02-11 (×5): qty 1

## 2020-02-11 MED ORDER — SODIUM CHLORIDE 0.9% FLUSH: 3.0000 mL | INTRAVENOUS | Status: DC | PRN

## 2020-02-11 MED ORDER — ADULT MULTIVITAMIN W/MINERALS CH
1.0000 | ORAL_TABLET | Freq: Every day | ORAL | Status: DC
  Administered 2020-02-11 – 2020-02-15 (×5): 1 via ORAL
  Filled 2020-02-11 (×5): qty 1

## 2020-02-11 MED ORDER — SODIUM CHLORIDE 0.9% FLUSH
3.0000 mL | Freq: Two times a day (BID) | INTRAVENOUS | Status: DC
  Administered 2020-02-11 – 2020-02-15 (×6): 3 mL via INTRAVENOUS

## 2020-02-11 MED ORDER — IOHEXOL 350 MG/ML SOLN
75.0000 mL | Freq: Once | INTRAVENOUS | Status: AC | PRN
  Administered 2020-02-11: 75 mL via INTRAVENOUS

## 2020-02-11 MED ORDER — SODIUM CHLORIDE 0.9 % IV SOLN: 250.0000 mL | INTRAVENOUS | Status: DC | PRN

## 2020-02-11 MED ORDER — TRAZODONE HCL 50 MG PO TABS
50.0000 mg | ORAL_TABLET | Freq: Every evening | ORAL | Status: DC | PRN
  Administered 2020-02-11 – 2020-02-14 (×2): 50 mg via ORAL
  Filled 2020-02-11 (×2): qty 1

## 2020-02-11 MED ORDER — DEXAMETHASONE SODIUM PHOSPHATE 10 MG/ML IJ SOLN
6.0000 mg | INTRAMUSCULAR | Status: DC
  Administered 2020-02-11 – 2020-02-13 (×3): 6 mg via INTRAVENOUS
  Filled 2020-02-11 (×3): qty 1

## 2020-02-11 MED ORDER — ALBUTEROL SULFATE HFA 108 (90 BASE) MCG/ACT IN AERS
2.0000 | INHALATION_SPRAY | Freq: Four times a day (QID) | RESPIRATORY_TRACT | Status: DC
  Administered 2020-02-11 – 2020-02-15 (×15): 2 via RESPIRATORY_TRACT
  Filled 2020-02-11: qty 6.7

## 2020-02-11 MED ORDER — SODIUM CHLORIDE 0.9 % IV SOLN: 100.0000 mg | Freq: Every day | INTRAVENOUS | Status: DC

## 2020-02-11 MED ORDER — SODIUM CHLORIDE 0.9 % IV SOLN: 200.0000 mg | Freq: Once | INTRAVENOUS | Status: DC

## 2020-02-11 MED ORDER — MELATONIN 3 MG PO TABS
3.0000 mg | ORAL_TABLET | Freq: Every evening | ORAL | Status: DC | PRN
  Filled 2020-02-11: qty 1

## 2020-02-11 MED ORDER — DEXAMETHASONE SODIUM PHOSPHATE 10 MG/ML IJ SOLN: 6.0000 mg | INTRAMUSCULAR | Status: DC

## 2020-02-11 MED ORDER — ONDANSETRON HCL 4 MG/2ML IJ SOLN: 4.0000 mg | Freq: Four times a day (QID) | INTRAMUSCULAR | Status: DC | PRN

## 2020-02-11 NOTE — ED Notes (Addendum)
Pt roomed. Sats 92% on RA after transfer from [facility] and short walk. 96% on RA at rest. Denies any CP, temp 98.1

## 2020-02-11 NOTE — ED Notes (Signed)
Pt provided with trazodone at this time per request, assisted with repositioning and lights were dimmed for comfort. Pt states she is going to attempt to sleep at this time

## 2020-02-11 NOTE — ED Notes (Signed)
2LNC applied per Dr. Beather Arbour verbal order

## 2020-02-11 NOTE — H&P (Signed)
History and Physical    Sheila Krueger QBH:[id number] DOB: Mar 31, 1935 DOA: 02/11/2020  PCP: Kirk Ruths, MD   Patient coming from: Home  I have personally briefly reviewed patient's old medical records in [place]  Chief Complaint: Shortness of breath  HPI: Sheila Krueger is a 85 y.o. 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  Patient tested positive for the COVID-19 virus on 02/10/20.  She had symptoms of shortness of breath for 2 days prior to testing positive.  Shortness of breath is associated with fever, chills, chest pain cough and congestion but she denies having any nausea, no vomiting or diaphoresis.  She denies having abdominal pain, no changes in her bowel habits or any urinary symptoms. Patient has received 2 doses of the COVID-19 vaccine. In the ER she had room air pulse oximetry between 92% - 96%. She had a T-max of 99.9, blood pressure 148/74, pulse rate 99, respiratory rate 20 Labs show sodium 138, potassium 3.7, chloride 102, bicarb 24, glucose 115, BUN 13, creatinine 0.6, AST 23, ALT 17, BNP 293, LDH 206, Troponin 64 >> 82, white count 4, hemoglobin 11.6, hematocrit 37.2, MCV 86.3, platelet count 202, fibrin derivatives 2125. CT angiogram of the chest shows no evidence of pulmonary embolism. Chest x-ray reviewed by me shows hazy bilateral opacities likely multifocal infection Twelve-lead EKG reviewed by me shows sinus tachycardia   ED Course: Patient is an 85 year old female who was sent to the ER by her primary care provider for evaluation of shortness of breath, fever, chills, cough and congestion.  Patient tested positive for COVID-19 virus on 02/10/20 and completed 2 doses of the COVID-19 vaccine.  Chest x-ray shows bilateral opacities.  Patient was ambulated in the ER and was noted to become tachypneic with respiratory rate of 31 pulse oximetry was holding  anywhere from 93 to 97%.  Patient will be admitted to the hospital due to her comorbidities and received remdesivir and Decadron in the ER.  Review of Systems: As per HPI otherwise 10 point review of systems negative.    Past Medical History:  Diagnosis Date  . 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 Arthritis   . CNS lymphoma ([place])   . Hypertension     No past surgical history on file.   reports that she has never smoked. She has never used smokeless tobacco. She reports previous alcohol use. She reports previous drug use.  Allergies  Allergen Reactions  . Aspirin Other (See Comments)    Tachycardia  . Other Other (See Comments)    Cough malaise    Family History  Problem Relation Age of Onset  . Hypertension Sister      Prior to Admission medications   Medication Sig Start Date End Date Taking? Authorizing Provider  acetaminophen (TYLENOL 8 HOUR ARTHRITIS PAIN) 650 MG CR tablet Take 650 mg by mouth every 8 (eight) hours as needed for pain.   Yes [provider]  bumetanide (BUMEX) 1 MG tablet Take 1 mg by mouth daily. 01/27/20  Yes [provider]  [employer] MELATONIN 3 MG TABS tablet Take 3 mg by mouth at bedtime as needed.  11/29/19  Yes [provider]  ferrous sulfate 325 (65 FE) MG EC tablet Take 1 tablet by mouth every morning. 01/20/20  Yes [provider]  hydrochlorothiazide (HYDRODIURIL) 25 MG tablet Take 25 mg by mouth daily. 02/10/19  Yes  [provider]  ibuprofen (ADVIL) 200 MG tablet Take 200-800 mg by mouth every 6 (six) hours as needed for pain.   Yes [provider]  Multiple Vitamin (MULTIVITAMIN) capsule Take 1 capsule by mouth daily.   Yes [provider]  ondansetron (ZOFRAN) 8 MG tablet Take 8 mg by mouth every 8 (eight) hours as needed for nausea. 12/28/19  Yes [provider]  pantoprazole (PROTONIX) 20 MG tablet Take 1 tablet (20 mg total) by mouth daily. 07/11/19  Yes Sindy Guadeloupe, MD  potassium  chloride (KLOR-CON) 10 MEQ tablet Take 10 mEq by mouth daily. 01/27/20  Yes [provider]  rosuvastatin (CRESTOR) 10 MG tablet Take 1 tablet by mouth daily. 11/30/19  Yes [provider]  traZODone (DESYREL) 50 MG tablet Take 50 mg by mouth at bedtime as needed for sleep. 01/15/20  Yes [provider]  dexamethasone (DECADRON) 4 MG tablet Take 1 tablet (4 mg total) by mouth 2 (two) times daily with a meal. Patient not taking: Reported on 02/11/2020 07/11/19   Sindy Guadeloupe, MD  temozolomide (TEMODAR) 100 MG capsule Take 100 mg by mouth daily. Patient not taking: Reported on 02/11/2020 12/08/19   [provider]    Physical Exam: Vitals:   02/11/20 0545 02/11/20 0615 02/11/20 0630 02/11/20 0645  BP: 126/69 122/70 127/74 130/74  Pulse: 83 91 88 81  Resp:      Temp:      TempSrc:      SpO2: 100% 100% 99% 100%     Vitals:   02/11/20 0545 02/11/20 0615 02/11/20 0630 02/11/20 0645  BP: 126/69 122/70 127/74 130/74  Pulse: 83 91 88 81  Resp:      Temp:      TempSrc:      SpO2: 100% 100% 99% 100%    Constitutional: NAD, alert and oriented x 3.  Acutely ill-appearing Eyes: PERRL, lids and conjunctivae pallor ENMT: Mucous membranes are moist.  Neck: normal, supple, no masses, no thyromegaly Respiratory:  Bilateral air entry, no wheezing, no crackles. Normal respiratory effort. No accessory muscle use.  Cardiovascular: Regular rate and rhythm, no murmurs / rubs / gallops. No extremity edema. 2+ pedal pulses. No carotid bruits.  Abdomen: no tenderness, no masses palpated. No hepatosplenomegaly. Bowel sounds positive.  Musculoskeletal: no clubbing / cyanosis. No joint deformity upper and lower extremities.  Skin: no rashes, lesions, ulcers.  Neurologic: No gross focal neurologic deficit. Psychiatric: Normal mood and affect.   Labs on Admission: I have personally reviewed following labs and imaging studies  CBC: Recent Labs  Lab 02/10/20 2012  WBC 4.0   HGB 11.6*  HCT 37.2  MCV 86.3  PLT 250   Basic Metabolic Panel: Recent Labs  Lab 02/10/20 2012  NA 138  K 3.7  CL 102  CO2 24  GLUCOSE 115*  BUN 13  CREATININE 0.66  CALCIUM 9.3   GFR: CrCl cannot be calculated (Unknown ideal weight.). Liver Function Tests: Recent Labs  Lab 02/10/20 2012  AST 23  ALT 17  ALKPHOS 81  BILITOT 0.7  PROT 7.7  ALBUMIN 4.4   No results for input(s): LIPASE, AMYLASE in the last 168 hours. No results for input(s): AMMONIA in the last 168 hours. Coagulation Profile: No results for input(s): INR, PROTIME in the last 168 hours. Cardiac Enzymes: No results for input(s): CKTOTAL, CKMB, CKMBINDEX, TROPONINI in the last 168 hours. BNP (last 3 results) No results for input(s): PROBNP in the last 8760  hours. HbA1C: No results for input(s): HGBA1C in the last 72 hours. CBG: No results for input(s): GLUCAP in the last 168 hours. Lipid Profile: No results for input(s): CHOL, HDL, LDLCALC, TRIG, CHOLHDL, LDLDIRECT in the last 72 hours. Thyroid Function Tests: No results for input(s): TSH, T4TOTAL, FREET4, T3FREE, THYROIDAB in the last 72 hours. Anemia Panel: Recent Labs    02/11/20 0506  FERRITIN 47   Urine analysis:    Component Value Date/Time   COLORURINE STRAW (A) 10/10/2019 2327   APPEARANCEUR CLEAR (A) 10/10/2019 2327   LABSPEC 1.005 10/10/2019 2327   PHURINE 6.0 10/10/2019 2327   GLUCOSEU NEGATIVE 10/10/2019 2327   HGBUR NEGATIVE 10/10/2019 2327   BILIRUBINUR NEGATIVE 10/10/2019 2327   KETONESUR NEGATIVE 10/10/2019 2327   PROTEINUR NEGATIVE 10/10/2019 2327   NITRITE NEGATIVE 10/10/2019 2327   LEUKOCYTESUR NEGATIVE 10/10/2019 2327    Radiological Exams on Admission: CT Angio Chest PE W/Cm &/Or Wo Cm  Result Date: 02/11/2020 CLINICAL DATA:  Shortness of breath, COVID, chest pain, cough EXAM: CT ANGIOGRAPHY CHEST WITH CONTRAST TECHNIQUE: Multidetector CT imaging of the chest was performed using the standard protocol during  bolus administration of intravenous contrast. Multiplanar CT image reconstructions and MIPs were obtained to evaluate the vascular anatomy. CONTRAST:  74mL OMNIPAQUE IOHEXOL 350 MG/ML SOLN COMPARISON:  Chest radiograph dated 02/11/2020 FINDINGS: Cardiovascular: Satisfactory opacification the bilateral pulmonary arteries to the segmental level. No evidence of pulmonary embolism. Study is not tailored for evaluation of the thoracic aorta,. No evidence of thoracic aortic aneurysm. Atherosclerotic calcifications of the aortic arch. Heart is top-normal in size.  No pericardial effusion. Coronary atherosclerosis of the LAD. Mediastinum/Nodes: No suspicious mediastinal lymphadenopathy. Lungs/Pleura: Evaluation of the lung parenchyma is constrained by respiratory motion. Within that constraint, there are no suspicious pulmonary nodules. Minimal dependent opacities in the posterior upper lobes (series 6/image 26), possibly atelectasis or mild infection. No focal consolidation. Trace left pleural effusion. No pneumothorax. Upper Abdomen: Visualized upper abdomen is grossly unremarkable. Musculoskeletal: Degenerative changes of the visualized thoracolumbar spine. Review of the MIP images confirms the above findings. IMPRESSION: No evidence of pulmonary embolism. Minimal dependent atelectasis in the lungs bilaterally, less likely mild infection. Trace left pleural effusion. Aortic Atherosclerosis (ICD10-I70.0). Electronically Signed   By: Julian Hy M.D.   On: 02/11/2020 06:42   DG Chest Port 1 View  Result Date: 02/11/2020 CLINICAL DATA:  COVID-19 EXAM: PORTABLE CHEST 1 VIEW COMPARISON:  10/10/2019 FINDINGS: Hazy bilateral opacities. Cardiomediastinal contours are normal. No pleural effusion. Right basilar atelectasis. IMPRESSION: Hazy bilateral opacities, likely multifocal infection. Electronically Signed   By: Ulyses Jarred M.D.   On: 02/11/2020 04:29    EKG: Independently reviewed.  Sinus  tachycardia  Assessment/Plan Principal Problem:   Pneumonia due to COVID-19 virus Active Problems:   Elevated troponin   Hypertension    Pneumonia due to COVID-19 Patient presents to the ER for evaluation of cough, congestion, fever, chills and shortness of breath. She tested positive for the COVID-19 virus on 02/10/20 but had symptoms for 2 days prior She has chest x-ray findings suggestive of viral pneumonia and room air pulse oximetry is between 93 - 96% Start patient on remdesivir per protocol Place patient on Decadron   Elevated troponin Patient with complaints of chest pain EKG with no acute ST-T wave changes Patient noted to have a bump in her troponin Continue aspirin We will request cardiology consult   Hypertension Patient is normotensive Hold HCTZ for now  DVT prophylaxis: Lovenox Code Status:  Full code Family Communication: Greater than 50% of time was spent discussing plan of care with patient at the bedside. She verbalizes understanding and agrees with the plan. Disposition Plan: Back to previous home environment Consults called: Cardiology    Jazaria Jarecki MD [facility]     02/11/2020, 8:02 AM

## 2020-02-11 NOTE — Progress Notes (Signed)
Remdesivir - Pharmacy Brief Note     A/P:  Remdesivir 200 mg IVPB once followed by 100 mg IVPB daily x 4 days.   Hart Robinsons, PharmD Clinical Pharmacist  02/11/2020 4:12 AM

## 2020-02-11 NOTE — Progress Notes (Signed)
Cardiology Consultation:   Patient ID: Sheila Krueger MRN: [id number]; DOB: 1935-04-25  Admit date: 02/11/2020 Date of Consult: 02/11/2020  Primary Care Provider: Kirk Ruths, MD [place] Cardiologist: New to [facility], [place] consulting [facility] Electrophysiologist:  None    Patient Profile:   Sheila Krueger is a 85 y.o. female with a hx of lymphoma, hypertension, cerebral aneurysm who is being seen today for the evaluation of elevated troponins at the request of Dr. Francine Graven.  History of Present Illness:   Sheila Krueger history of CNS lymphoma, hypertension who presents to the hospital due to worsening shortness of breath over 3 days.  Patient states not feeling well with symptoms of fever, fatigue, shortness of breath with exertion.  Denies chest pain.  Denies any history of heart disease.  Was previously on blood pressure medications, but her blood pressure normalized and was taken off BP meds.  Her niece who lives with her was recently diagnosed with COVID-19.  Due to persistent symptoms, family advised her to get checked.  2 days ago, patient underwent nasal swab for COVID-19 which was positive.  She was advised to come to the emergency room due to persistent symptoms.  Of note, she has had both vaccines doses.  She was diagnosed with CNS lymphoma and started on chemotherapy 5 months ago.  Being followed at [facility] for chemo treatments.  In the ED, patient had temperatures up to 101, chest x-ray this morning showed bilateral opacities suspicious for multifocal infection.  Serial troponin levels were 64, 82, 77.  EKG shows sinus tachycardia.   Past Medical History:  Diagnosis Date  . 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 Arthritis   . CNS lymphoma ([place])   . Hypertension     No past surgical history on file.   Home Medications:  Prior to Admission medications   Medication Sig Start Date End Date Taking? Authorizing Provider  acetaminophen (TYLENOL 8 HOUR  ARTHRITIS PAIN) 650 MG CR tablet Take 650 mg by mouth every 8 (eight) hours as needed for pain.   Yes [provider]  bumetanide (BUMEX) 1 MG tablet Take 1 mg by mouth daily. 01/27/20  Yes [provider]  [employer] MELATONIN 3 MG TABS tablet Take 3 mg by mouth at bedtime as needed.  11/29/19  Yes [provider]  ferrous sulfate 325 (65 FE) MG EC tablet Take 1 tablet by mouth every morning. 01/20/20  Yes [provider]  hydrochlorothiazide (HYDRODIURIL) 25 MG tablet Take 25 mg by mouth daily. 02/10/19  Yes [provider]  ibuprofen (ADVIL) 200 MG tablet Take 200-800 mg by mouth every 6 (six) hours as needed for pain.   Yes [provider]  Multiple Vitamin (MULTIVITAMIN) capsule Take 1 capsule by mouth daily.   Yes [provider]  ondansetron (ZOFRAN) 8 MG tablet Take 8 mg by mouth every 8 (eight) hours as needed for nausea. 12/28/19  Yes [provider]  pantoprazole (PROTONIX) 20 MG tablet Take 1 tablet (20 mg total) by mouth daily. 07/11/19  Yes Sindy Guadeloupe, MD  potassium chloride (KLOR-CON) 10 MEQ tablet Take 10 mEq by mouth daily. 01/27/20  Yes [provider]  rosuvastatin (CRESTOR) 10 MG tablet Take 1 tablet by mouth daily. 11/30/19  Yes [provider]  traZODone (DESYREL) 50 MG tablet Take 50 mg by mouth at bedtime as needed for sleep. 01/15/20  Yes [provider]  dexamethasone (DECADRON) 4 MG tablet Take 1 tablet (4  mg total) by mouth 2 (two) times daily with a meal. Patient not taking: Reported on 02/11/2020 07/11/19   Sindy Guadeloupe, MD  temozolomide (TEMODAR) 100 MG capsule Take 100 mg by mouth daily. Patient not taking: Reported on 02/11/2020 12/08/19   [provider]    Inpatient Medications: Scheduled Meds: . albuterol  2 puff Inhalation Q6H  . vitamin C  500 mg Oral Daily  . aspirin EC  81 mg Oral Daily  . dexamethasone (DECADRON) injection  6 mg Intravenous Q24H  . enoxaparin  (LOVENOX) injection  40 mg Subcutaneous Q24H  . ferrous sulfate  325 mg Oral q morning - 10a  . multivitamin with minerals  1 tablet Oral Daily  . pantoprazole  20 mg Oral Daily  . rosuvastatin  10 mg Oral Daily  . sodium chloride flush  3 mL Intravenous Q12H  . zinc sulfate  220 mg Oral Daily   Continuous Infusions: . sodium chloride    . [START ON 02/12/2020] remdesivir 100 mg in NS 100 mL     PRN Meds: sodium chloride, acetaminophen, guaiFENesin-dextromethorphan, melatonin, ondansetron **OR** ondansetron (ZOFRAN) IV, sodium chloride flush, traZODone  Allergies:    Allergies  Allergen Reactions  . Aspirin Other (See Comments)    Tachycardia  . Other Other (See Comments)    Cough malaise    Social History:   Social History   Socioeconomic History  . Marital status: Widowed    Spouse name: Not on file  . Number of children: Not on file  . Years of education: Not on file  . Highest education level: Not on file  Occupational History  . Not on file  Tobacco Use  . Smoking status: Never Smoker  . Smokeless tobacco: Never Used  Vaping Use  . Vaping Use: Never used  Substance and Sexual Activity  . Alcohol use: Not Currently    Comment: rare beer  . Drug use: Not Currently  . Sexual activity: Not Currently  Other Topics Concern  . Not on file  Social History Narrative  . Not on file   Social Determinants of Health   Financial Resource Strain:   . Difficulty of Paying Living Expenses: Not on file  Food Insecurity:   . Worried About Charity fundraiser in the Last Year: Not on file  . Ran Out of Food in the Last Year: Not on file  Transportation Needs:   . Lack of Transportation (Medical): Not on file  . Lack of Transportation (Non-Medical): Not on file  Physical Activity:   . Days of Exercise per Week: Not on file  . Minutes of Exercise per Session: Not on file  Stress:   . Feeling of Stress : Not on file  Social Connections:   . Frequency of Communication  with Friends and Family: Not on file  . Frequency of Social Gatherings with Friends and Family: Not on file  . Attends Religious Services: Not on file  . Active Member of Clubs or Organizations: Not on file  . Attends Archivist Meetings: Not on file  . Marital Status: Not on file  Intimate Partner Violence:   . Fear of Current or Ex-Partner: Not on file  . Emotionally Abused: Not on file  . Physically Abused: Not on file  . Sexually Abused: Not on file    Family History:    Family History  Problem Relation Age of Onset  . Hypertension Sister      ROS:  Please  see the history of present illness.   All other ROS reviewed and negative.     Physical Exam/Data:   Vitals:   02/11/20 0915 02/11/20 0930 02/11/20 0945 02/11/20 1015  BP: 122/69 108/76 124/85 (!) 142/84  Pulse: 82 80 84 92  Resp:    (!) 22  Temp:      TempSrc:      SpO2: 98% 98% 97% 97%    Intake/Output Summary (Last 24 hours) at 02/11/2020 1126 Last data filed at 02/11/2020 0649 Gross per 24 hour  Intake 250 ml  Output --  Net 250 ml   Last 3 Weights 10/10/2019 07/11/2019 07/11/2019  Weight (lbs) 172 lb 189 lb 9.5 oz 190 lb  Weight (kg) 78.019 kg 86 kg 86.183 kg     There is no height or weight on file to calculate BMI.  General:  Well nourished, well developed, mild respiratory distress HEENT: normal Lymph: no adenopathy Neck: no JVD Endocrine:  No thryomegaly Vascular: No carotid bruits; FA pulses 2+ bilaterally without bruits  Cardiac:  normal S1, S2; RRR; no murmur  Lungs: Expiratory wheezing noted on exam. Abd: soft, nontender, no hepatomegaly  Ext: no edema Musculoskeletal:  No deformities, BUE and BLE strength normal and equal Skin: warm and dry  Neuro:  CNs 2-12 intact, no focal abnormalities noted Psych:  Normal affect   EKG:  The EKG was personally reviewed and demonstrates: Sinus tachycardia Telemetry:  Telemetry was personally reviewed and demonstrates:-Rhythm  Relevant CV  Studies: Echocardiogram pending  Laboratory Data:  High Sensitivity Troponin:   Recent Labs  Lab 02/10/20 2012 02/11/20 0243 02/11/20 0506  TROPONINIHS 64* 82* 77*     Chemistry Recent Labs  Lab 02/10/20 2012  NA 138  K 3.7  CL 102  CO2 24  GLUCOSE 115*  BUN 13  CREATININE 0.66  CALCIUM 9.3  GFRNONAA >60  GFRAA >60  ANIONGAP 12    Recent Labs  Lab 02/10/20 2012  PROT 7.7  ALBUMIN 4.4  AST 23  ALT 17  ALKPHOS 81  BILITOT 0.7   Hematology Recent Labs  Lab 02/10/20 2012  WBC 4.0  RBC 4.31  HGB 11.6*  HCT 37.2  MCV 86.3  MCH 26.9  MCHC 31.2  RDW 14.7  PLT 202   BNP Recent Labs  Lab 02/11/20 0506  BNP 293.1*    DDimer No results for input(s): DDIMER in the last 168 hours.   Radiology/Studies:  CT Angio Chest PE W/Cm &/Or Wo Cm  Result Date: 02/11/2020 CLINICAL DATA:  Shortness of breath, COVID, chest pain, cough EXAM: CT ANGIOGRAPHY CHEST WITH CONTRAST TECHNIQUE: Multidetector CT imaging of the chest was performed using the standard protocol during bolus administration of intravenous contrast. Multiplanar CT image reconstructions and MIPs were obtained to evaluate the vascular anatomy. CONTRAST:  55mL OMNIPAQUE IOHEXOL 350 MG/ML SOLN COMPARISON:  Chest radiograph dated 02/11/2020 FINDINGS: Cardiovascular: Satisfactory opacification the bilateral pulmonary arteries to the segmental level. No evidence of pulmonary embolism. Study is not tailored for evaluation of the thoracic aorta,. No evidence of thoracic aortic aneurysm. Atherosclerotic calcifications of the aortic arch. Heart is top-normal in size.  No pericardial effusion. Coronary atherosclerosis of the LAD. Mediastinum/Nodes: No suspicious mediastinal lymphadenopathy. Lungs/Pleura: Evaluation of the lung parenchyma is constrained by respiratory motion. Within that constraint, there are no suspicious pulmonary nodules. Minimal dependent opacities in the posterior upper lobes (series 6/image 26), possibly  atelectasis or mild infection. No focal consolidation. Trace left pleural effusion. No pneumothorax. Upper  Abdomen: Visualized upper abdomen is grossly unremarkable. Musculoskeletal: Degenerative changes of the visualized thoracolumbar spine. Review of the MIP images confirms the above findings. IMPRESSION: No evidence of pulmonary embolism. Minimal dependent atelectasis in the lungs bilaterally, less likely mild infection. Trace left pleural effusion. Aortic Atherosclerosis (ICD10-I70.0). Electronically Signed   By: Julian Hy M.D.   On: 02/11/2020 06:42   DG Chest Port 1 View  Result Date: 02/11/2020 CLINICAL DATA:  COVID-19 EXAM: PORTABLE CHEST 1 VIEW COMPARISON:  10/10/2019 FINDINGS: Hazy bilateral opacities. Cardiomediastinal contours are normal. No pleural effusion. Right basilar atelectasis. IMPRESSION: Hazy bilateral opacities, likely multifocal infection. Electronically Signed   By: Ulyses Jarred M.D.   On: 02/11/2020 04:29   {  Assessment and Plan:   1.  Elevated troponins -Troponins minimally elevated -Denies chest pain -Most likely from demand supply mismatch. -Due to symptoms of shortness of breath, history of malignancy chemo use, get echo to evaluate overall systolic and diastolic function. -If echo is otherwise okay, no additional cardiac testing recommended.  2.  Dyspnea, COVID-19, pneumonia -Expiratory wheezing on exam -Management as per primary team -Echo to evaluate overall systolic and diastolic function.   Signed, Kate Sable, MD  02/11/2020 11:26 AM

## 2020-02-11 NOTE — ED Notes (Signed)
Pt ambulatory to the bathroom without assistance. Meal tray brought to pt at this time.

## 2020-02-11 NOTE — ED Notes (Signed)
Pt ambulated in room. sats down to 93-94% on RA, but winded w/exertion.

## 2020-02-11 NOTE — ED Provider Notes (Signed)
[facility] Emergency Department Provider Note   ____________________________________________   First MD Initiated Contact with Patient 02/11/20 0401     (approximate)  I have reviewed the triage vital signs and the nursing notes.   HISTORY  Chief Complaint Shortness of Breath    HPI Sheila Krueger is a 85 y.o. female sent to the ED by her PCP for shortness of breath. 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 Seen by her PCP with + Covid test. Given her co-morbidities she was sent to the ED for further evaluation, treatment and likely admission. Endorses fever, chills, cough, chest pain, shortness of breath. Denies abdominal pain, nausea, vomiting, diarrhea. Has had both doses of Covid-19 vaccination.     Past Medical History:  Diagnosis Date  . 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 Arthritis   . CNS lymphoma ([place])   . Hypertension     Patient Active Problem List   Diagnosis Date Noted  . Goals of care, counseling/discussion 07/12/2019  . 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  Prior to Admission medications   Medication Sig Start Date End Date Taking? Authorizing Provider  acetaminophen (TYLENOL 8 HOUR ARTHRITIS PAIN) 650 MG CR tablet Take 650 mg by mouth every 8 (eight) hours as needed for pain.   Yes [provider]  bumetanide (BUMEX) 1 MG tablet Take 1 mg by mouth daily. 01/27/20  Yes [provider]  [employer] MELATONIN 3 MG TABS tablet Take 3 mg by mouth at bedtime as needed.  11/29/19  Yes [provider]  ferrous sulfate 325 (65 FE) MG EC tablet Take 1 tablet by mouth every morning. 01/20/20  Yes [provider]  hydrochlorothiazide (HYDRODIURIL) 25 MG tablet Take 25 mg by mouth daily. 02/10/19  Yes [provider]  ibuprofen (ADVIL) 200 MG tablet Take 200-800 mg by mouth every 6 (six) hours as needed for  pain.   Yes [provider]  Multiple Vitamin (MULTIVITAMIN) capsule Take 1 capsule by mouth daily.   Yes [provider]  ondansetron (ZOFRAN) 8 MG tablet Take 8 mg by mouth every 8 (eight) hours as needed for nausea. 12/28/19  Yes [provider]  pantoprazole (PROTONIX) 20 MG tablet Take 1 tablet (20 mg total) by mouth daily. 07/11/19  Yes Sindy Guadeloupe, MD  potassium chloride (KLOR-CON) 10 MEQ tablet Take 10 mEq by mouth daily. 01/27/20  Yes [provider]  rosuvastatin (CRESTOR) 10 MG tablet Take 1 tablet by mouth daily. 11/30/19  Yes [provider]  traZODone (DESYREL) 50 MG tablet Take 50 mg by mouth at bedtime as needed for sleep. 01/15/20  Yes [provider]  dexamethasone (DECADRON) 4 MG tablet Take 1 tablet (4 mg total) by mouth 2 (two) times daily with a meal. Patient not taking: Reported on 02/11/2020 07/11/19   Sindy Guadeloupe, MD  temozolomide (TEMODAR) 100 MG capsule Take 100 mg by mouth daily. Patient not taking: Reported on 02/11/2020 12/08/19   [provider]    Allergies Aspirin and Other  Family History  Problem Relation Age of Onset  . Hypertension Sister     Social History Social History   Tobacco Use  . Smoking status: Never Smoker  . Smokeless tobacco: Never Used  Vaping Use  . Vaping Use: Never used  Substance Use Topics  . Alcohol use: Not  Currently    Comment: rare beer  . Drug use: Not Currently    Review of Systems  Constitutional: Positive for weakness, fever/chills Eyes: No visual changes. ENT: No sore throat. Cardiovascular: Positive for chest pain. Respiratory: Positive for shortness of breath. Gastrointestinal: No abdominal pain.  No nausea, no vomiting.  No diarrhea.  No constipation. Genitourinary: Negative for dysuria. Musculoskeletal: Negative for back pain. Skin: Negative for rash. Neurological: Negative for headaches, focal weakness or  numbness.   ____________________________________________   PHYSICAL EXAM:  VITAL SIGNS: ED Triage Vitals  Enc Vitals Group     BP 02/10/20 2000 (!) 148/74     Pulse Rate 02/10/20 2000 99     Resp 02/10/20 2000 (!) 22     Temp 02/10/20 2000 99.9 F (37.7 C)     Temp Source 02/10/20 2215 Oral     SpO2 02/10/20 2000 94 %     Weight --      Height --      Head Circumference --      Peak Flow --      Pain Score 02/10/20 2005 0     Pain Loc --      Pain Edu? --      Excl. in [place]? --     Constitutional: Alert and oriented.  Elderly appearing and in mild acute distress. Eyes: Conjunctivae are normal. PERRL. EOMI. Head: Atraumatic. Nose: No congestion/rhinnorhea. Mouth/Throat: Mucous membranes are mildly dry.   Neck: No stridor.   Cardiovascular: Normal rate, regular rhythm. Grossly normal heart sounds.  Good peripheral circulation. Respiratory: Increased respiratory effort.  No retractions. Lungs diminished bibasilarly. Gastrointestinal: Soft and nontender. No distention. No abdominal bruits. No CVA tenderness. Musculoskeletal: No lower extremity tenderness nor edema.  No joint effusions. Neurologic:  Normal speech and language. No gross focal neurologic deficits are appreciated.  Skin:  Skin is warm, dry and intact. No rash noted.  No petechiae. Psychiatric: Mood and affect are normal. Speech and behavior are normal.  ____________________________________________   LABS (all labs ordered are listed, but only abnormal results are displayed)  Labs Reviewed  CBC - Abnormal; Notable for the following components:      Result Value   Hemoglobin 11.6 (*)    All other components within normal limits  COMPREHENSIVE METABOLIC PANEL - Abnormal; Notable for the following components:   Glucose, Bld 115 (*)    All other components within normal limits  BRAIN NATRIURETIC PEPTIDE - Abnormal; Notable for the following components:   B Natriuretic Peptide 293.1 (*)    All other  components within normal limits  FIBRIN DERIVATIVES D-DIMER ([facility] ONLY) - Abnormal; Notable for the following components:   Fibrin derivatives D-dimer ([facility]) 2,125.42 (*)    All other components within normal limits  LACTATE DEHYDROGENASE - Abnormal; Notable for the following components:   LDH 206 (*)    All other components within normal limits  TROPONIN I (HIGH SENSITIVITY) - Abnormal; Notable for the following components:   Troponin I (High Sensitivity) 64 (*)    All other components within normal limits  TROPONIN I (HIGH SENSITIVITY) - Abnormal; Notable for the following components:   Troponin I (High Sensitivity) 82 (*)    All other components within normal limits  FERRITIN  FIBRINOGEN  C-REACTIVE PROTEIN  HEPATITIS B SURFACE ANTIGEN  PROCALCITONIN  ABO/RH  ABO/RH   ____________________________________________  EKG  ED ECG REPORT I, Dafina Suk J, the attending physician, personally viewed and interpreted this ECG.   Date: 02/11/2020  EKG Time: 2014  Rate: 101  Rhythm: sinus tachycardia  Axis: Normal  Intervals:none  ST&T Change: Nonspecific  ____________________________________________  RADIOLOGY  ED MD interpretation: Multifocal pneumonia; no PE on CTA chest  Official radiology report(s): CT Angio Chest PE W/Cm &/Or Wo Cm  Result Date: 02/11/2020 CLINICAL DATA:  Shortness of breath, COVID, chest pain, cough EXAM: CT ANGIOGRAPHY CHEST WITH CONTRAST TECHNIQUE: Multidetector CT imaging of the chest was performed using the standard protocol during bolus administration of intravenous contrast. Multiplanar CT image reconstructions and MIPs were obtained to evaluate the vascular anatomy. CONTRAST:  71mL OMNIPAQUE IOHEXOL 350 MG/ML SOLN COMPARISON:  Chest radiograph dated 02/11/2020 FINDINGS: Cardiovascular: Satisfactory opacification the bilateral pulmonary arteries to the segmental level. No evidence of pulmonary embolism. Study is not tailored for evaluation of the  thoracic aorta,. No evidence of thoracic aortic aneurysm. Atherosclerotic calcifications of the aortic arch. Heart is top-normal in size.  No pericardial effusion. Coronary atherosclerosis of the LAD. Mediastinum/Nodes: No suspicious mediastinal lymphadenopathy. Lungs/Pleura: Evaluation of the lung parenchyma is constrained by respiratory motion. Within that constraint, there are no suspicious pulmonary nodules. Minimal dependent opacities in the posterior upper lobes (series 6/image 26), possibly atelectasis or mild infection. No focal consolidation. Trace left pleural effusion. No pneumothorax. Upper Abdomen: Visualized upper abdomen is grossly unremarkable. Musculoskeletal: Degenerative changes of the visualized thoracolumbar spine. Review of the MIP images confirms the above findings. IMPRESSION: No evidence of pulmonary embolism. Minimal dependent atelectasis in the lungs bilaterally, less likely mild infection. Trace left pleural effusion. Aortic Atherosclerosis (ICD10-I70.0). Electronically Signed   By: Julian Hy M.D.   On: 02/11/2020 06:42   DG Chest Port 1 View  Result Date: 02/11/2020 CLINICAL DATA:  COVID-19 EXAM: PORTABLE CHEST 1 VIEW COMPARISON:  10/10/2019 FINDINGS: Hazy bilateral opacities. Cardiomediastinal contours are normal. No pleural effusion. Right basilar atelectasis. IMPRESSION: Hazy bilateral opacities, likely multifocal infection. Electronically Signed   By: Ulyses Jarred M.D.   On: 02/11/2020 04:29    ____________________________________________   PROCEDURES  Procedure(s) performed (including Critical Care):  .1-3 Lead EKG Interpretation Performed by: Paulette Blanch, MD Authorized by: Paulette Blanch, MD     Interpretation: normal     ECG rate:  98   ECG rate assessment: normal     Rhythm: sinus rhythm     Ectopy: none     Conduction: normal   Comments:     Patient placed on cardiac monitor to evaluate for arrhythmias   CRITICAL CARE Performed by: Paulette Blanch   Total critical care time: 30 minutes  Critical care time was exclusive of separately billable procedures and treating other patients.  Critical care was necessary to treat or prevent imminent or life-threatening deterioration.  Critical care was time spent personally by me on the following activities: development of treatment plan with patient and/or surrogate as well as nursing, discussions with consultants, evaluation of patient's response to treatment, examination of patient, obtaining history from patient or surrogate, ordering and performing treatments and interventions, ordering and review of laboratory studies, ordering and review of radiographic studies, pulse oximetry and re-evaluation of patient's condition.   ____________________________________________   INITIAL IMPRESSION / ASSESSMENT AND PLAN / ED COURSE  As part of my medical decision making, I reviewed the following data within the [place] notes reviewed and incorporated, Labs reviewed, EKG interpreted, Old chart reviewed, Radiograph reviewed, Discussed with admitting physician and Notes from prior ED visits     Lashonta Khiley Lieser was evaluated  in Emergency Department on 02/11/2020 for the symptoms described in the history of present illness. She was evaluated in the context of the global COVID-19 pandemic, which necessitated consideration that the patient might be at risk for infection with the SARS-CoV-2 virus that causes COVID-19. Institutional protocols and algorithms that pertain to the evaluation of patients at risk for COVID-19 are in a state of rapid change based on information released by regulatory bodies including the CDC and federal and state organizations. These policies and algorithms were followed during the patient's care in the ED.    85 year old female sent by her PCP for further evaluation of chest pain and shortness of breath secondary to COVID-19 infection. Differential  includes, but is not limited to, viral syndrome, bronchitis including COPD exacerbation, pneumonia, reactive airway disease including asthma, CHF including exacerbation with or without pulmonary/interstitial edema, pneumothorax, ACS, thoracic trauma, and pulmonary embolism.  Laboratory results notable for increasing troponin which is likely secondary to demand ischemia.  Will administer aspirin.  Chest x-ray notable for multifocal infiltrates.  Will obtain CTA chest to evaluate for PE.  Will start IV Decadron and remdesivir.  Anticipate hospitalization.   Clinical Course as of Feb 11 720  Sat Feb 11, 2020  0452 Patient ambulated a short distance with pulse oximeter on room air -became tachypneic with a respiratory rate of 31, saturations decreased from 97 to 93%.  Patient placed back in bed and 2 L nasal cannula oxygen applied.   [JS]    Clinical Course User Index [JS] Paulette Blanch, MD     ____________________________________________   FINAL CLINICAL IMPRESSION(S) / ED DIAGNOSES  Final diagnoses:  Fever, unspecified fever cause  Elevated troponin  Pneumonia due to COVID-19 virus     ED Discharge Orders    None       Note:  This document was prepared using Dragon voice recognition software and may include unintentional dictation errors.   Paulette Blanch, MD 02/11/20 [phone]

## 2020-02-11 NOTE — ED Notes (Signed)
Pt ambulatory to the restroom without assistance.  

## 2020-02-11 NOTE — ED Notes (Signed)
Pt resting in bed at this time, dinner tray taken after pt finished, provided with TV remote. Pt has no complaints or requests at this time, pt is sitting comfortably in bed

## 2020-02-12 ENCOUNTER — Other Ambulatory Visit: Payer: Self-pay

## 2020-02-12 ENCOUNTER — Encounter: Payer: Self-pay | Admitting: Internal Medicine

## 2020-02-12 DIAGNOSIS — C8589 Other specified types of non-Hodgkin lymphoma, extranodal and solid organ sites: Secondary | ICD-10-CM

## 2020-02-12 DIAGNOSIS — E785 Hyperlipidemia, unspecified: Secondary | ICD-10-CM | POA: Insufficient documentation

## 2020-02-12 LAB — CBC WITH DIFFERENTIAL/PLATELET
Abs Immature Granulocytes: 0.01 10*3/uL (ref 0.00–0.07)
Basophils Absolute: 0 10*3/uL (ref 0.0–0.1)
Basophils Relative: 0 %
Eosinophils Absolute: 0 10*3/uL (ref 0.0–0.5)
Eosinophils Relative: 1 %
HCT: 38.1 % (ref 36.0–46.0)
Hemoglobin: 11.8 g/dL — ABNORMAL LOW (ref 12.0–15.0)
Immature Granulocytes: 0 %
Lymphocytes Relative: 30 %
Lymphs Abs: 0.7 10*3/uL (ref 0.7–4.0)
MCH: 26.9 pg (ref 26.0–34.0)
MCHC: 31 g/dL (ref 30.0–36.0)
MCV: 87 fL (ref 80.0–100.0)
Monocytes Absolute: 0.5 10*3/uL (ref 0.1–1.0)
Monocytes Relative: 20 %
Neutro Abs: 1.1 10*3/uL — ABNORMAL LOW (ref 1.7–7.7)
Neutrophils Relative %: 49 %
Platelets: 189 10*3/uL (ref 150–400)
RBC: 4.38 MIL/uL (ref 3.87–5.11)
RDW: 14.6 % (ref 11.5–15.5)
WBC: 2.3 10*3/uL — ABNORMAL LOW (ref 4.0–10.5)
nRBC: 0 % (ref 0.0–0.2)

## 2020-02-12 LAB — COMPREHENSIVE METABOLIC PANEL
ALT: 15 U/L (ref 0–44)
AST: 21 U/L (ref 15–41)
Albumin: 3.7 g/dL (ref 3.5–5.0)
Alkaline Phosphatase: 72 U/L (ref 38–126)
Anion gap: 9 (ref 5–15)
BUN: 11 mg/dL (ref 8–23)
CO2: 26 mmol/L (ref 22–32)
Calcium: 8.8 mg/dL — ABNORMAL LOW (ref 8.9–10.3)
Chloride: 107 mmol/L (ref 98–111)
Creatinine, Ser: 0.55 mg/dL (ref 0.44–1.00)
GFR calc Af Amer: >60 mL/min (ref >60)
GFR calc non Af Amer: >60 mL/min (ref >60)
Glucose, Bld: 105 mg/dL — ABNORMAL HIGH (ref 70–99)
Potassium: 3.5 mmol/L (ref 3.5–5.1)
Sodium: 142 mmol/L (ref 135–145)
Total Bilirubin: 0.6 mg/dL (ref 0.3–1.2)
Total Protein: 6.8 g/dL (ref 6.5–8.1)

## 2020-02-12 LAB — FIBRIN DERIVATIVES D-DIMER (ARMC ONLY): Fibrin derivatives D-dimer (ARMC): 1540.49 ng/mL (FEU) — ABNORMAL HIGH (ref 0.00–499.00)

## 2020-02-12 LAB — C-REACTIVE PROTEIN: CRP: <0.5 mg/dL (ref <1.0)

## 2020-02-12 LAB — FERRITIN: Ferritin: 49 ng/mL (ref 11–307)

## 2020-02-12 LAB — PHOSPHORUS: Phosphorus: 3.8 mg/dL (ref 2.5–4.6)

## 2020-02-12 LAB — MAGNESIUM: Magnesium: 2 mg/dL (ref 1.7–2.4)

## 2020-02-12 MED ORDER — TEMOZOLOMIDE 100 MG PO CAPS: 100.0000 mg | ORAL_CAPSULE | Freq: Every day | ORAL | Status: DC

## 2020-02-12 NOTE — Progress Notes (Signed)
Patient arrived from ER via w/c in stable condition. Pt oriented to unit and room.

## 2020-02-12 NOTE — Progress Notes (Addendum)
Progress Note  Patient Name: Sheila Krueger Date of Encounter: 02/12/2020  [facility] Cardiologist: [place], [place] rounding  Subjective   Breathing is a little better, currently eating breakfast.  Inpatient Medications    Scheduled Meds: . albuterol  2 puff Inhalation Q6H  . vitamin C  500 mg Oral Daily  . aspirin EC  81 mg Oral Daily  . dexamethasone (DECADRON) injection  6 mg Intravenous Q24H  . enoxaparin (LOVENOX) injection  40 mg Subcutaneous Q24H  . ferrous sulfate  325 mg Oral q morning - 10a  . multivitamin with minerals  1 tablet Oral Daily  . pantoprazole  20 mg Oral Daily  . rosuvastatin  10 mg Oral Daily  . sodium chloride flush  3 mL Intravenous Q12H  . zinc sulfate  220 mg Oral Daily   Continuous Infusions: . sodium chloride    . remdesivir 100 mg in NS 100 mL 100 mg (02/12/20 1045)   PRN Meds: sodium chloride, acetaminophen, guaiFENesin-dextromethorphan, melatonin, ondansetron **OR** ondansetron (ZOFRAN) IV, sodium chloride flush, traZODone   Vital Signs    Vitals:   02/12/20 0500 02/12/20 0600 02/12/20 0630 02/12/20 0700  BP: 134/74 127/76 138/80 (!) 153/79  Pulse: 86 85 89 94  Resp: 19 19 (!) 22 (!) 22  Temp:      TempSrc:      SpO2: 97% 98% 96% 96%   No intake or output data in the 24 hours ending 02/12/20 1159 Last 3 Weights 10/10/2019 07/11/2019 07/11/2019  Weight (lbs) 172 lb 189 lb 9.5 oz 190 lb  Weight (kg) 78.019 kg 86 kg 86.183 kg      Telemetry    Sinus rhythm- Personally Reviewed  ECG    New tracing- Personally Reviewed  Physical Exam   GEN: No acute distress.   Neck: No JVD Cardiac: RRR, no murmurs, rubs, or gallops.  Respiratory:  expiratory wheezing. GI: Soft, nontender, non-distended  MS: No edema; No deformity. Neuro:  Nonfocal  Psych: Normal affect   Labs    High Sensitivity Troponin:   Recent Labs  Lab 02/10/20 2012 02/11/20 0243 02/11/20 0506 02/11/20 1112  TROPONINIHS 64* 82* 77* 65*       Chemistry Recent Labs  Lab 02/10/20 2012 02/12/20 0428  NA 138 142  K 3.7 3.5  CL 102 107  CO2 24 26  GLUCOSE 115* 105*  BUN 13 11  CREATININE 0.66 0.55  CALCIUM 9.3 8.8*  PROT 7.7 6.8  ALBUMIN 4.4 3.7  AST 23 21  ALT 17 15  ALKPHOS 81 72  BILITOT 0.7 0.6  GFRNONAA >60 >60  GFRAA >60 >60  ANIONGAP 12 9     Hematology Recent Labs  Lab 02/10/20 2012 02/12/20 0428  WBC 4.0 2.3*  RBC 4.31 4.38  HGB 11.6* 11.8*  HCT 37.2 38.1  MCV 86.3 87.0  MCH 26.9 26.9  MCHC 31.2 31.0  RDW 14.7 14.6  PLT 202 189    BNP Recent Labs  Lab 02/11/20 0506  BNP 293.1*     DDimer No results for input(s): DDIMER in the last 168 hours.   Radiology    CT Angio Chest PE W/Cm &/Or Wo Cm  Result Date: 02/11/2020 CLINICAL DATA:  Shortness of breath, COVID, chest pain, cough EXAM: CT ANGIOGRAPHY CHEST WITH CONTRAST TECHNIQUE: Multidetector CT imaging of the chest was performed using the standard protocol during bolus administration of intravenous contrast. Multiplanar CT image reconstructions and MIPs were obtained to evaluate the vascular anatomy. CONTRAST:  65mL OMNIPAQUE IOHEXOL 350 MG/ML SOLN COMPARISON:  Chest radiograph dated 02/11/2020 FINDINGS: Cardiovascular: Satisfactory opacification the bilateral pulmonary arteries to the segmental level. No evidence of pulmonary embolism. Study is not tailored for evaluation of the thoracic aorta,. No evidence of thoracic aortic aneurysm. Atherosclerotic calcifications of the aortic arch. Heart is top-normal in size.  No pericardial effusion. Coronary atherosclerosis of the LAD. Mediastinum/Nodes: No suspicious mediastinal lymphadenopathy. Lungs/Pleura: Evaluation of the lung parenchyma is constrained by respiratory motion. Within that constraint, there are no suspicious pulmonary nodules. Minimal dependent opacities in the posterior upper lobes (series 6/image 26), possibly atelectasis or mild infection. No focal consolidation. Trace left pleural  effusion. No pneumothorax. Upper Abdomen: Visualized upper abdomen is grossly unremarkable. Musculoskeletal: Degenerative changes of the visualized thoracolumbar spine. Review of the MIP images confirms the above findings. IMPRESSION: No evidence of pulmonary embolism. Minimal dependent atelectasis in the lungs bilaterally, less likely mild infection. Trace left pleural effusion. Aortic Atherosclerosis (ICD10-I70.0). Electronically Signed   By: Julian Hy M.D.   On: 02/11/2020 06:42   DG Chest Port 1 View  Result Date: 02/11/2020 CLINICAL DATA:  COVID-19 EXAM: PORTABLE CHEST 1 VIEW COMPARISON:  10/10/2019 FINDINGS: Hazy bilateral opacities. Cardiomediastinal contours are normal. No pleural effusion. Right basilar atelectasis. IMPRESSION: Hazy bilateral opacities, likely multifocal infection. Electronically Signed   By: Ulyses Jarred M.D.   On: 02/11/2020 04:29    Cardiac Studies   Echo pending  Patient Profile     85 y.o. female with history of CNS lymphoma, hypertension, cerebral aneurysm presenting to the hospital with worsening shortness of breath.  Diagnosed with COVID-19 pneumonia.  Being seen for elevated troponins.  Assessment & Plan    1.  Elevated troponins -Troponins minimally elevated 82-->77-->64 -Patient continues to deny chest pain -Troponin elevation as a result of demand supply mismatch.. -Echo was ordered, will review and make additional recs if any gross abnormalities noted. -No additional cardiac testing or input at this time.   2.  Dyspnea, COVID-19, pneumonia -Expiratory wheezing on exam -Management of COVID-19 and shortness of breath per primary team  Cardiology will sign off. Will provide additional input if gross abnormalities noted on echo. Thank you for this consult.   Total encounter time more 35 minutes  Greater than 50% was spent in counseling and coordination of care with the patient     Signed, Kate Sable, MD  02/12/2020, 11:59 AM

## 2020-02-12 NOTE — Evaluation (Signed)
Physical Therapy Evaluation Patient Details Name: Sheila Krueger MRN: [id number] DOB: 05-Apr-1935 Today's Date: 02/12/2020   History of Present Illness  presented to ER secondary to progressive SOB; admitted for management of PNA due to COVID-19 virus.  Clinical Impression  Upon evaluation, patient alert and oriented; follows commands and demonstrates good efforts with mobility tasks.  Generally hyperverbal, requiring intermittent redirection to task at hand.  Bilat UE/LE strength and ROM grossly symmetrical and WFL; no focal weakness appreciated. Able to complete bed mobility with mod indep; sit/stand, basic transfers and gait (90') without assist device, sup/mod indep.  Demonstrates reciprocal stepping pattern, mildly antalgic (baseline per patient); fair cadence and gait speed.  No overt buckling, LOB or safety concern.  Min/no reports of SOB, sats >95% on RA throughout Appears to be at baseline level of functional ability; no acute PT needs identified. Will complete initial PT order at this time; please re-consult as needs change.    Follow Up Recommendations No PT follow up    Equipment Recommendations       Recommendations for Other Services       Precautions / Restrictions Precautions Precautions: None Restrictions Weight Bearing Restrictions: No      Mobility  Bed Mobility Overal bed mobility: Modified Independent                Transfers Overall transfer level: Modified independent Equipment used: None             General transfer comment: good LE strength/stability with movement transition  Ambulation/Gait Ambulation/Gait assistance: Supervision Gait Distance (Feet): 90 Feet Assistive device: None       General Gait Details: reciprocal stepping pattern, mildly antalgic (baseline per patient); fair cadence and gait speed.  No overt buckling, LOB or safety concern.  Min/no reports of SOB, sats >95% on RA throughout  Stairs             Wheelchair Mobility    Modified Rankin (Stroke Patients Only)       Balance Overall balance assessment: Modified Independent                                           Pertinent Vitals/Pain Pain Assessment: No/denies pain    Home Living Family/patient expects to be discharged to:: Private residence Living Arrangements:  (lives with niece)   Type of Home: House       Home Layout: One level Home Equipment: None      Prior Function Level of Independence: Independent         Comments: Indep with ADLs, household and community mobilization; enjoys going to park to walk on track several times a week. No home O2. Denies recent fall history.     Hand Dominance        Extremity/Trunk Assessment   Upper Extremity Assessment Upper Extremity Assessment: Overall WFL for tasks assessed    Lower Extremity Assessment Lower Extremity Assessment: Overall WFL for tasks assessed       Communication   Communication: No difficulties  Cognition Arousal/Alertness: Awake/alert Behavior During Therapy: WFL for tasks assessed/performed Overall Cognitive Status: Within Functional Limits for tasks assessed                                        General Comments General comments (  skin integrity, edema, etc.): able to retrieve item from floor without difficulty, demonstrates functional reach approx 6" from immediate BOS    Exercises     Assessment/Plan    PT Assessment Patent does not need any further PT services  PT Problem List         PT Treatment Interventions      PT Goals (Current goals can be found in the Care Plan section)  Acute Rehab PT Goals Patient Stated Goal: to go home once I'm done with that medicine PT Goal Formulation: With patient Time For Goal Achievement: 02/12/20 Potential to Achieve Goals: Good    Frequency     Barriers to discharge        Co-evaluation               AM-PAC PT "6 Clicks"  Mobility  Outcome Measure Help needed turning from your back to your side while in a flat bed without using bedrails?: None Help needed moving from lying on your back to sitting on the side of a flat bed without using bedrails?: None Help needed moving to and from a bed to a chair (including a wheelchair)?: None Help needed standing up from a chair using your arms (e.g., wheelchair or bedside chair)?: None Help needed to walk in hospital room?: None Help needed climbing 3-5 steps with a railing? : None 6 Click Score: 24    End of Session   Activity Tolerance: Patient tolerated treatment well Patient left: in bed;with call bell/phone within reach Nurse Communication: Mobility status PT Visit Diagnosis: Muscle weakness (generalized) (M62.81);Difficulty in walking, not elsewhere classified (R26.2)    Time: 1448-1510 PT Time Calculation (min) (ACUTE ONLY): 22 min   Charges:   PT Evaluation $PT Eval Moderate Complexity: 1 Mod          Sonora Catlin H. Owens Shark, PT, DPT, NCS 02/12/20, 5:18 PM [phone]

## 2020-02-12 NOTE — ED Notes (Signed)
Pt asleep in room at this time, lights off

## 2020-02-12 NOTE — Progress Notes (Signed)
Patient ID: Sheila Krueger, female   DOB: 02-28-1935, 85 y.o.   MRN: [id number] [facility] PROGRESS NOTE  Amenah Tucci [id number]:[id number] DOB: 1934-06-24 DOA: 02/11/2020 PCP: Kirk Ruths, MD  HPI/Subjective: Patient seen after she walked back and forth to the bathroom.  Since she had her oxygen off I had her walk without the oxygen and her pulse ox ranged between 95 and 100%.  Patient states that she has some wheezing.  Still feels short of breath and has some cough.  Admitted with COVID-19 pneumonia.  Objective: Vitals:   02/12/20 0630 02/12/20 0700  BP: 138/80 (!) 153/79  Pulse: 89 94  Resp: (!) 22 (!) 22  Temp:    SpO2: 96% 96%    ROS: Review of Systems  Respiratory: Positive for cough, shortness of breath and wheezing.   Cardiovascular: Negative for palpitations.  Gastrointestinal: Negative for abdominal pain, diarrhea, nausea and vomiting.   Exam: Physical Exam HENT:     Nose: No mucosal edema.     Mouth/Throat:     Pharynx: No oropharyngeal exudate.  Eyes:     General: Lids are normal.     Conjunctiva/sclera: Conjunctivae normal.  Cardiovascular:     Rate and Rhythm: Normal rate and regular rhythm.     Heart sounds: Normal heart sounds, S1 normal and S2 normal.  Pulmonary:     Effort: Pulmonary effort is normal.     Breath sounds: Examination of the right-lower field reveals decreased breath sounds. Examination of the left-lower field reveals decreased breath sounds. Decreased breath sounds present. No wheezing, rhonchi or rales.  Abdominal:     Palpations: Abdomen is soft.     Tenderness: There is no abdominal tenderness.  Musculoskeletal:     Right lower leg: No swelling.     Left lower leg: No swelling.  Skin:    General: Skin is warm.     Findings: No rash.  Neurological:     Mental Status: She is alert and oriented to person, place, and time.       Data Reviewed: Basic Metabolic Panel: Recent Labs  Lab 02/10/20 2012  02/12/20 0428  NA 138 142  K 3.7 3.5  CL 102 107  CO2 24 26  GLUCOSE 115* 105*  BUN 13 11  CREATININE 0.66 0.55  CALCIUM 9.3 8.8*  MG  --  2.0  PHOS  --  3.8   Liver Function Tests: Recent Labs  Lab 02/10/20 2012 02/12/20 0428  AST 23 21  ALT 17 15  ALKPHOS 81 72  BILITOT 0.7 0.6  PROT 7.7 6.8  ALBUMIN 4.4 3.7  CBC: Recent Labs  Lab 02/10/20 2012 02/12/20 0428  WBC 4.0 2.3*  NEUTROABS  --  1.1*  HGB 11.6* 11.8*  HCT 37.2 38.1  MCV 86.3 87.0  PLT 202 189   BNP (last 3 results) Recent Labs    10/10/19 2120 02/11/20 0506  BNP 68.0 293.1*     Studies: CT Angio Chest PE W/Cm &/Or Wo Cm  Result Date: 02/11/2020 CLINICAL DATA:  Shortness of breath, COVID, chest pain, cough EXAM: CT ANGIOGRAPHY CHEST WITH CONTRAST TECHNIQUE: Multidetector CT imaging of the chest was performed using the standard protocol during bolus administration of intravenous contrast. Multiplanar CT image reconstructions and MIPs were obtained to evaluate the vascular anatomy. CONTRAST:  72mL OMNIPAQUE IOHEXOL 350 MG/ML SOLN COMPARISON:  Chest radiograph dated 02/11/2020 FINDINGS: Cardiovascular: Satisfactory opacification the bilateral pulmonary arteries to the segmental level. No evidence of pulmonary embolism.  Study is not tailored for evaluation of the thoracic aorta,. No evidence of thoracic aortic aneurysm. Atherosclerotic calcifications of the aortic arch. Heart is top-normal in size.  No pericardial effusion. Coronary atherosclerosis of the LAD. Mediastinum/Nodes: No suspicious mediastinal lymphadenopathy. Lungs/Pleura: Evaluation of the lung parenchyma is constrained by respiratory motion. Within that constraint, there are no suspicious pulmonary nodules. Minimal dependent opacities in the posterior upper lobes (series 6/image 26), possibly atelectasis or mild infection. No focal consolidation. Trace left pleural effusion. No pneumothorax. Upper Abdomen: Visualized upper abdomen is grossly  unremarkable. Musculoskeletal: Degenerative changes of the visualized thoracolumbar spine. Review of the MIP images confirms the above findings. IMPRESSION: No evidence of pulmonary embolism. Minimal dependent atelectasis in the lungs bilaterally, less likely mild infection. Trace left pleural effusion. Aortic Atherosclerosis (ICD10-I70.0). Electronically Signed   By: Julian Hy M.D.   On: 02/11/2020 06:42   DG Chest Port 1 View  Result Date: 02/11/2020 CLINICAL DATA:  COVID-19 EXAM: PORTABLE CHEST 1 VIEW COMPARISON:  10/10/2019 FINDINGS: Hazy bilateral opacities. Cardiomediastinal contours are normal. No pleural effusion. Right basilar atelectasis. IMPRESSION: Hazy bilateral opacities, likely multifocal infection. Electronically Signed   By: Ulyses Jarred M.D.   On: 02/11/2020 04:29    Scheduled Meds:  albuterol  2 puff Inhalation Q6H   vitamin C  500 mg Oral Daily   aspirin EC  81 mg Oral Daily   dexamethasone (DECADRON) injection  6 mg Intravenous Q24H   enoxaparin (LOVENOX) injection  40 mg Subcutaneous Q24H   ferrous sulfate  325 mg Oral q morning - 10a   multivitamin with minerals  1 tablet Oral Daily   pantoprazole  20 mg Oral Daily   rosuvastatin  10 mg Oral Daily   sodium chloride flush  3 mL Intravenous Q12H   zinc sulfate  220 mg Oral Daily   Continuous Infusions:  sodium chloride     remdesivir 100 mg in NS 100 mL Stopped (02/12/20 1256)    Assessment/Plan:  1. COVID-19 pneumonia.  Received day 2 of remdesivir today.  Continue Decadron.  Continue albuterol inhaler.  Patient off oxygen at this point.  We discussed finishing up remdesivir course as outpatient but patient nervous about that today.  Reassess for continuation of remdesivir as outpatient after tomorrow's dose. 2. 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  In speaking with the patient's niece she has been off the chemo pill and was supposed to have a  follow-up appointment and MRI as outpatient 4. Hyperlipidemia unspecified on Crestor 5. Essential hypertension holding hydrochlorothiazide      Code Status:     Code Status Orders  (From admission, onward)         Start     Ordered   02/11/20 0734  Full code  Continuous        02/11/20 0735        Code Status History    This patient has a current code status but no historical code status.   Advance Care Planning Activity     Family Communication: Spoke with niece on the phone Disposition Plan: Status is: Inpatient  Dispo: The patient is from: Home              Anticipated d/c is to: Home              Anticipated d/c date is: Potential 02/13/2020              Patient currently feeling  nervous about traveling to [facility] for remdesivir infusion.  Currently being treated for Covid pneumonia now off oxygen.  Reassess tomorrow for potential disposition with outpatient infusion completion of remdesivir.  Time spent: 34 minutes  [place]

## 2020-02-13 ENCOUNTER — Inpatient Hospital Stay (HOSPITAL_COMMUNITY)
Admit: 2020-02-13 | Discharge: 2020-02-13 | Disposition: A | Discharge status: Home / Self Care | Payer: Medicare PPO | Attending: Cardiology | Admitting: Cardiology

## 2020-02-13 DIAGNOSIS — I340 Nonrheumatic mitral (valve) insufficiency: Secondary | ICD-10-CM

## 2020-02-13 DIAGNOSIS — I361 Nonrheumatic tricuspid (valve) insufficiency: Secondary | ICD-10-CM

## 2020-02-13 LAB — ECHOCARDIOGRAM COMPLETE
AR max vel: 1.91 cm2
AV Area VTI: 1.64 cm2
AV Area mean vel: 2.03 cm2
AV Mean grad: 5 mmHg
AV Peak grad: 11.3 mmHg
Ao pk vel: 1.68 m/s
Area-P 1/2: 3.6 cm2
Calc EF: 43.2 %
S' Lateral: 4.27 cm
Single Plane A2C EF: 40.5 %
Single Plane A4C EF: 42.5 %

## 2020-02-13 LAB — CBC WITH DIFFERENTIAL/PLATELET
Abs Immature Granulocytes: 0.01 10*3/uL (ref 0.00–0.07)
Basophils Absolute: 0 10*3/uL (ref 0.0–0.1)
Basophils Relative: 0 %
Eosinophils Absolute: 0.1 10*3/uL (ref 0.0–0.5)
Eosinophils Relative: 2 %
HCT: 34 % — ABNORMAL LOW (ref 36.0–46.0)
Hemoglobin: 11.1 g/dL — ABNORMAL LOW (ref 12.0–15.0)
Immature Granulocytes: 0 %
Lymphocytes Relative: 26 %
Lymphs Abs: 1 10*3/uL (ref 0.7–4.0)
MCH: 27.4 pg (ref 26.0–34.0)
MCHC: 32.6 g/dL (ref 30.0–36.0)
MCV: 84 fL (ref 80.0–100.0)
Monocytes Absolute: 0.5 10*3/uL (ref 0.1–1.0)
Monocytes Relative: 13 %
Neutro Abs: 2.3 10*3/uL (ref 1.7–7.7)
Neutrophils Relative %: 59 %
Platelets: 200 10*3/uL (ref 150–400)
RBC: 4.05 MIL/uL (ref 3.87–5.11)
RDW: 14.8 % (ref 11.5–15.5)
WBC: 3.8 10*3/uL — ABNORMAL LOW (ref 4.0–10.5)
nRBC: 0 % (ref 0.0–0.2)

## 2020-02-13 LAB — MAGNESIUM: Magnesium: 1.9 mg/dL (ref 1.7–2.4)

## 2020-02-13 LAB — PHOSPHORUS: Phosphorus: 2.9 mg/dL (ref 2.5–4.6)

## 2020-02-13 LAB — COMPREHENSIVE METABOLIC PANEL
ALT: 14 U/L (ref 0–44)
AST: 20 U/L (ref 15–41)
Albumin: 3.6 g/dL (ref 3.5–5.0)
Alkaline Phosphatase: 60 U/L (ref 38–126)
Anion gap: 10 (ref 5–15)
BUN: 14 mg/dL (ref 8–23)
CO2: 27 mmol/L (ref 22–32)
Calcium: 8.8 mg/dL — ABNORMAL LOW (ref 8.9–10.3)
Chloride: 104 mmol/L (ref 98–111)
Creatinine, Ser: 0.67 mg/dL (ref 0.44–1.00)
GFR calc Af Amer: >60 mL/min (ref >60)
GFR calc non Af Amer: >60 mL/min (ref >60)
Glucose, Bld: 94 mg/dL (ref 70–99)
Potassium: 3.5 mmol/L (ref 3.5–5.1)
Sodium: 141 mmol/L (ref 135–145)
Total Bilirubin: 0.6 mg/dL (ref 0.3–1.2)
Total Protein: 6.5 g/dL (ref 6.5–8.1)

## 2020-02-13 LAB — FERRITIN: Ferritin: 48 ng/mL (ref 11–307)

## 2020-02-13 LAB — C-REACTIVE PROTEIN: CRP: <0.5 mg/dL (ref <1.0)

## 2020-02-13 LAB — FIBRIN DERIVATIVES D-DIMER (ARMC ONLY): Fibrin derivatives D-dimer (ARMC): 1107.35 ng/mL (FEU) — ABNORMAL HIGH (ref 0.00–499.00)

## 2020-02-13 MED ORDER — DEXAMETHASONE 6 MG PO TABS: 6.0000 mg | ORAL_TABLET | Freq: Every day | ORAL | 0 refills | Status: AC

## 2020-02-13 MED ORDER — ASCORBIC ACID 500 MG PO TABS: 500.0000 mg | ORAL_TABLET | Freq: Every day | ORAL | 0 refills | Status: AC

## 2020-02-13 MED ORDER — ZINC SULFATE 220 (50 ZN) MG PO CAPS: 220.0000 mg | ORAL_CAPSULE | Freq: Every day | ORAL | 0 refills | Status: AC

## 2020-02-13 MED ORDER — ALBUTEROL SULFATE HFA 108 (90 BASE) MCG/ACT IN AERS: 2.0000 | INHALATION_SPRAY | Freq: Four times a day (QID) | RESPIRATORY_TRACT | 0 refills | Status: AC

## 2020-02-13 NOTE — Progress Notes (Deleted)
Patient scheduled for outpatient Remdesivir infusions at 10 AM on Tuesday 9/7 and Wednesday 9/8 at [facility]. Please inform the patient to park at [place], as staff will be escorting the patient through the [place] entrance of the hospital.    There is a wave flag banner located near the entrance on [address]. Turn into this entrance and immediately turn left and park in 1 of the 5 designated Covid Infusion Parking spots. There is a phone number on the sign, please call and let the staff know what spot you are in and we will come out and get you. For questions call [phone].  Thanks.  * If patient is getting dropped off, you may have your ride pull up to the Main Entrance of [facility]. Please stay in the car and call [phone], staff will meet you at your car and escort you into the hospital and back to the clinic.

## 2020-02-13 NOTE — Progress Notes (Signed)
*  PRELIMINARY RESULTS* Echocardiogram 2D Echocardiogram has been performed.  Sheila Krueger 02/13/2020, 10:34 AM 

## 2020-02-13 NOTE — Progress Notes (Signed)
Patient ID: Sheila Krueger, female   DOB: 21-Aug-1934, 85 y.o.   MRN: [id number] [facility] PROGRESS NOTE  Gracey Tolle RXV:[id number] DOB: 1935-05-21 DOA: 02/11/2020 PCP: Kirk Ruths, MD  HPI/Subjective: Patient vomited this morning.  Patient was reassessed in the afternoon and she did eat some lunch.  We were trying to set up outpatient remdesivir infusions but unable to transportation for tomorrow.  Patient is breathing better.  Objective: Vitals:   02/13/20 0716 02/13/20 1124  BP: 125/74 120/71  Pulse: 87 81  Resp: 19 18  Temp: 98.2 F (36.8 C) 98.2 F (36.8 C)  SpO2: 93% 97%    Intake/Output Summary (Last 24 hours) at 02/13/2020 1434 Last data filed at 02/13/2020 0300 Gross per 24 hour  Intake 100.66 ml  Output --  Net 100.66 ml   Filed Weights   02/13/20 1000  Weight: 78 kg    ROS: Review of Systems  Respiratory: Positive for cough. Negative for shortness of breath.   Cardiovascular: Negative for chest pain.  Gastrointestinal: Negative for abdominal pain, nausea and vomiting.   Exam: Physical Exam HENT:     Nose: No mucosal edema.     Mouth/Throat:     Pharynx: No oropharyngeal exudate.  Eyes:     General: Lids are normal.     Conjunctiva/sclera: Conjunctivae normal.  Cardiovascular:     Rate and Rhythm: Normal rate and regular rhythm.     Heart sounds: Normal heart sounds, S1 normal and S2 normal.  Pulmonary:     Breath sounds: No decreased breath sounds, wheezing, rhonchi or rales.  Abdominal:     Palpations: Abdomen is soft.     Tenderness: There is no abdominal tenderness.  Musculoskeletal:     Right ankle: No swelling.     Left ankle: No swelling.  Skin:    General: Skin is warm.     Findings: No rash.  Neurological:     Mental Status: She is alert and oriented to person, place, and time.       Data Reviewed: Basic Metabolic Panel: Recent Labs  Lab 02/10/20 2012 02/12/20 0428 02/13/20 0414  NA 138 142 141  K 3.7  3.5 3.5  CL 102 107 104  CO2 24 26 27   GLUCOSE 115* 105* 94  BUN 13 11 14   CREATININE 0.66 0.55 0.67  CALCIUM 9.3 8.8* 8.8*  MG  --  2.0 1.9  PHOS  --  3.8 2.9   Liver Function Tests: Recent Labs  Lab 02/10/20 2012 02/12/20 0428 02/13/20 0414  AST 23 21 20   ALT 17 15 14   ALKPHOS 81 72 60  BILITOT 0.7 0.6 0.6  PROT 7.7 6.8 6.5  ALBUMIN 4.4 3.7 3.6   CBC: Recent Labs  Lab 02/10/20 2012 02/12/20 0428 02/13/20 0414  WBC 4.0 2.3* 3.8*  NEUTROABS  --  1.1* 2.3  HGB 11.6* 11.8* 11.1*  HCT 37.2 38.1 34.0*  MCV 86.3 87.0 84.0  PLT 202 189 200   BNP (last 3 results) Recent Labs    10/10/19 2120 02/11/20 0506  BNP 68.0 293.1*     Scheduled Meds: . albuterol  2 puff Inhalation Q6H  . vitamin C  500 mg Oral Daily  . aspirin EC  81 mg Oral Daily  . dexamethasone (DECADRON) injection  6 mg Intravenous Q24H  . enoxaparin (LOVENOX) injection  40 mg Subcutaneous Q24H  . ferrous sulfate  325 mg Oral q morning - 10a  . multivitamin with minerals  1 tablet Oral Daily  . pantoprazole  20 mg Oral Daily  . rosuvastatin  10 mg Oral Daily  . sodium chloride flush  3 mL Intravenous Q12H  . zinc sulfate  220 mg Oral Daily   Continuous Infusions: . sodium chloride    . remdesivir 100 mg in NS 100 mL 100 mg (02/13/20 0815)    Assessment/Plan:  1. COVID-19 pneumonia.  Day 3 of remdesivir today.  Continue Decadron.  Continue albuterol inhaler.  Since patient was off oxygen I tried to set up outpatient infusions but unfortunately the transportation services were not open today to set up transportation.  Reassess tomorrow for disposition after day 4 of remdesivir. 2. Elevated troponin secondary to demand ischemia.  3. History of CNS lymphoma and aneurysm.  Patient has been off her chemo pill and will have to follow-up as outpatient 4. Hyperlipidemia unspecified on Crestor 5. Essential hypertension holding hydrochlorothiazide.  Blood pressure stable    Code Status:     Code  Status Orders  (From admission, onward)         Start     Ordered   02/11/20 0734  Full code  Continuous        02/11/20 0735        Code Status History    This patient has a current code status but no historical code status.   Advance Care Planning Activity     Family Communication: Spoke with both nieces on the phone Disposition Plan: Status is: Inpatient  Dispo: The patient is from: Home              Anticipated d/c is to: Home              Anticipated d/c date is: Now potential 02/14/2020              Patient currently being treated for Covid pneumonia receiving remdesivir here.  Will need transportation to finish up remdesivir course but unable to set up transportation for tomorrow.  Will reassess tomorrow  Time spent: 38 minutes  [place]

## 2020-02-14 ENCOUNTER — Inpatient Hospital Stay: Payer: Medicare PPO

## 2020-02-14 ENCOUNTER — Ambulatory Visit (HOSPITAL_COMMUNITY): Payer: Medicare PPO

## 2020-02-14 DIAGNOSIS — R062 Wheezing: Secondary | ICD-10-CM

## 2020-02-14 LAB — CBC WITH DIFFERENTIAL/PLATELET
Abs Immature Granulocytes: 0.02 10*3/uL (ref 0.00–0.07)
Basophils Absolute: 0 10*3/uL (ref 0.0–0.1)
Basophils Relative: 0 %
Eosinophils Absolute: 0.1 10*3/uL (ref 0.0–0.5)
Eosinophils Relative: 1 %
HCT: 35.1 % — ABNORMAL LOW (ref 36.0–46.0)
Hemoglobin: 11.3 g/dL — ABNORMAL LOW (ref 12.0–15.0)
Immature Granulocytes: 0 %
Lymphocytes Relative: 22 %
Lymphs Abs: 1 10*3/uL (ref 0.7–4.0)
MCH: 27 pg (ref 26.0–34.0)
MCHC: 32.2 g/dL (ref 30.0–36.0)
MCV: 84 fL (ref 80.0–100.0)
Monocytes Absolute: 0.6 10*3/uL (ref 0.1–1.0)
Monocytes Relative: 13 %
Neutro Abs: 2.8 10*3/uL (ref 1.7–7.7)
Neutrophils Relative %: 64 %
Platelets: 196 10*3/uL (ref 150–400)
RBC: 4.18 MIL/uL (ref 3.87–5.11)
RDW: 15 % (ref 11.5–15.5)
WBC: 4.5 10*3/uL (ref 4.0–10.5)
nRBC: 0 % (ref 0.0–0.2)

## 2020-02-14 LAB — COMPREHENSIVE METABOLIC PANEL
ALT: 15 U/L (ref 0–44)
AST: 20 U/L (ref 15–41)
Albumin: 3.6 g/dL (ref 3.5–5.0)
Alkaline Phosphatase: 58 U/L (ref 38–126)
Anion gap: 10 (ref 5–15)
BUN: 13 mg/dL (ref 8–23)
CO2: 28 mmol/L (ref 22–32)
Calcium: 8.5 mg/dL — ABNORMAL LOW (ref 8.9–10.3)
Chloride: 103 mmol/L (ref 98–111)
Creatinine, Ser: 0.64 mg/dL (ref 0.44–1.00)
GFR calc Af Amer: >60 mL/min (ref >60)
GFR calc non Af Amer: >60 mL/min (ref >60)
Glucose, Bld: 94 mg/dL (ref 70–99)
Potassium: 3.3 mmol/L — ABNORMAL LOW (ref 3.5–5.1)
Sodium: 141 mmol/L (ref 135–145)
Total Bilirubin: 0.6 mg/dL (ref 0.3–1.2)
Total Protein: 6.3 g/dL — ABNORMAL LOW (ref 6.5–8.1)

## 2020-02-14 LAB — FERRITIN: Ferritin: 43 ng/mL (ref 11–307)

## 2020-02-14 LAB — MAGNESIUM: Magnesium: 1.9 mg/dL (ref 1.7–2.4)

## 2020-02-14 LAB — PHOSPHORUS: Phosphorus: 3.3 mg/dL (ref 2.5–4.6)

## 2020-02-14 LAB — FIBRIN DERIVATIVES D-DIMER (ARMC ONLY): Fibrin derivatives D-dimer (ARMC): 1072.65 ng/mL (FEU) — ABNORMAL HIGH (ref 0.00–499.00)

## 2020-02-14 LAB — C-REACTIVE PROTEIN: CRP: <0.5 mg/dL (ref <1.0)

## 2020-02-14 MED ORDER — METHYLPREDNISOLONE SODIUM SUCC 125 MG IJ SOLR: 125.0000 mg | Freq: Once | INTRAMUSCULAR | Status: DC | PRN

## 2020-02-14 MED ORDER — METHYLPREDNISOLONE SODIUM SUCC 40 MG IJ SOLR
40.0000 mg | Freq: Two times a day (BID) | INTRAMUSCULAR | Status: DC
  Administered 2020-02-14 – 2020-02-15 (×2): 40 mg via INTRAVENOUS
  Filled 2020-02-14 (×2): qty 1

## 2020-02-14 MED ORDER — POTASSIUM CHLORIDE CRYS ER 20 MEQ PO TBCR
40.0000 meq | EXTENDED_RELEASE_TABLET | Freq: Once | ORAL | Status: AC
  Administered 2020-02-14: 15:00:00 40 meq via ORAL
  Filled 2020-02-14: qty 2

## 2020-02-14 MED ORDER — DIPHENHYDRAMINE HCL 50 MG/ML IJ SOLN: 50.0000 mg | Freq: Once | INTRAMUSCULAR | Status: DC | PRN

## 2020-02-14 MED ORDER — FAMOTIDINE IN NACL 20-0.9 MG/50ML-% IV SOLN: 20.0000 mg | Freq: Once | INTRAVENOUS | Status: DC | PRN

## 2020-02-14 MED ORDER — ALBUTEROL SULFATE HFA 108 (90 BASE) MCG/ACT IN AERS
2.0000 | INHALATION_SPRAY | Freq: Once | RESPIRATORY_TRACT | Status: DC | PRN
  Filled 2020-02-14: qty 6.7

## 2020-02-14 MED ORDER — SODIUM CHLORIDE 0.9 % IV SOLN: INTRAVENOUS | Status: DC | PRN

## 2020-02-14 MED ORDER — FUROSEMIDE 10 MG/ML IJ SOLN
40.0000 mg | Freq: Once | INTRAMUSCULAR | Status: AC
  Administered 2020-02-14: 40 mg via INTRAVENOUS
  Filled 2020-02-14: qty 4

## 2020-02-14 MED ORDER — EPINEPHRINE 0.3 MG/0.3ML IJ SOAJ
0.3000 mg | Freq: Once | INTRAMUSCULAR | Status: DC | PRN
  Filled 2020-02-14: qty 0.6

## 2020-02-14 MED ORDER — EPINEPHRINE 0.3 MG/0.3ML IJ SOAJ
0.3000 mg | Freq: Once | INTRAMUSCULAR | Status: DC | PRN
  Filled 2020-02-14: qty 0.3

## 2020-02-14 MED ORDER — SODIUM CHLORIDE 0.9 % IV SOLN: 100.0000 mg | Freq: Once | INTRAVENOUS | Status: DC

## 2020-02-14 MED ORDER — BUMETANIDE 1 MG PO TABS
1.0000 mg | ORAL_TABLET | Freq: Every day | ORAL | Status: DC
  Administered 2020-02-15: 09:00:00 1 mg via ORAL
  Filled 2020-02-14: qty 1

## 2020-02-14 NOTE — Progress Notes (Signed)
Patient ID: Sheila Krueger, female   DOB: [birth year], 85 y.o.   MRN: [id number] [facility] PROGRESS NOTE  Sheila Krueger [id number] DOB: [birth year] DOA: 02/11/2020 PCP: Kirk Ruths, MD  HPI/Subjective: Patient this morning had worsening shortness of breath.  She is coughing and said she was short of breath with minimal movement.  Admitted with COVID-19 pneumonia.  Still having coughing.  Objective: Vitals:   02/14/20 1111 02/14/20 1208  BP: 135/69   Pulse: 86   Resp: 18   Temp: 97.8 F (36.6 C)   SpO2: 95% 94%   No intake or output data in the 24 hours ending 02/14/20 1457 Filed Weights   02/13/20 1000  Weight: 78 kg    ROS: Review of Systems  Respiratory: Positive for cough, shortness of breath and wheezing.   Cardiovascular: Negative for chest pain.  Gastrointestinal: Negative for abdominal pain, nausea and vomiting.   Exam: Physical Exam HENT:     Nose: No mucosal edema.     Mouth/Throat:     Pharynx: No oropharyngeal exudate.  Eyes:     General: Lids are normal.     Conjunctiva/sclera: Conjunctivae normal.  Cardiovascular:     Rate and Rhythm: Normal rate and regular rhythm.     Heart sounds: Normal heart sounds, S1 normal and S2 normal.  Pulmonary:     Breath sounds: Examination of the right-middle field reveals wheezing. Examination of the left-middle field reveals wheezing. Examination of the right-lower field reveals decreased breath sounds and wheezing. Examination of the left-lower field reveals decreased breath sounds and wheezing. Decreased breath sounds and wheezing present. No rhonchi.  Abdominal:     Palpations: Abdomen is soft.     Tenderness: There is no abdominal tenderness.  Musculoskeletal:     Right ankle: No swelling.     Left ankle: No swelling.  Skin:    General: Skin is warm.     Findings: No rash.  Neurological:     Mental Status: She is alert and oriented to person, place, and time.       Data  Reviewed: Basic Metabolic Panel: Recent Labs  Lab 02/10/20 2012 02/12/20 0428 02/13/20 0414 02/14/20 0609  NA 138 142 141 141  K 3.7 3.5 3.5 3.3*  CL 102 107 104 103  CO2 24 26 27 28   GLUCOSE 115* 105* 94 94  BUN 13 11 14 13   CREATININE 0.66 0.55 0.67 0.64  CALCIUM 9.3 8.8* 8.8* 8.5*  MG  --  2.0 1.9 1.9  PHOS  --  3.8 2.9 3.3   Liver Function Tests: Recent Labs  Lab 02/10/20 2012 02/12/20 0428 02/13/20 0414 02/14/20 0609  AST 23 21 20 20   ALT 17 15 14 15   ALKPHOS 81 72 60 58  BILITOT 0.7 0.6 0.6 0.6  PROT 7.7 6.8 6.5 6.3*  ALBUMIN 4.4 3.7 3.6 3.6   CBC: Recent Labs  Lab 02/10/20 2012 02/12/20 0428 02/13/20 0414 02/14/20 0609  WBC 4.0 2.3* 3.8* 4.5  NEUTROABS  --  1.1* 2.3 2.8  HGB 11.6* 11.8* 11.1* 11.3*  HCT 37.2 38.1 34.0* 35.1*  MCV 86.3 87.0 84.0 84.0  PLT 202 189 200 196   BNP (last 3 results) Recent Labs    10/10/19 2120 02/11/20 0506  BNP 68.0 293.1*    Studies: ECHOCARDIOGRAM COMPLETE  Result Date: 02/13/2020    ECHOCARDIOGRAM REPORT   Patient Name:   Sheila Krueger Date of Exam: 02/13/2020 Medical Rec #:  [id number]  Height:       62.0 in Accession #:    [id number]         Weight:       172.0 lb Date of Birth:  08-29-1934         BSA:          1.793 m Patient Age:    32 years           BP:           124/69 mmHg Patient Gender: F                  HR:           96 bpm. Exam Location:  [facility] Procedure: 2D Echo, Color Doppler and Cardiac Doppler Indications:     R06.00 Dyspnea  History:         Patient has no prior history of Echocardiogram examinations.                  Risk Factors:Hypertension. Pt tested positive for COVID-19 on                  02/11/20.  Sonographer:     Charmayne Sheer RDCS (AE) Referring Phys:  [id number] Kate Sable Diagnosing Phys: Ida Rogue MD IMPRESSIONS  1. Left ventricular ejection fraction, by estimation, is 55%. The left ventricle has normal function. The left ventricle has no regional wall motion abnormalities.  There is mild left ventricular hypertrophy. Left ventricular diastolic parameters are consistent with Grade II diastolic dysfunction (pseudonormalization).  2. Right ventricular systolic function is normal. The right ventricular size is normal. There is moderately elevated pulmonary artery systolic pressure. The estimated right ventricular systolic pressure is 84.5 mmHg.  3. Left atrial size was mildly dilated.  4. Mild mitral valve regurgitation.  5. The inferior vena cava is dilated in size with >50% respiratory variability, suggesting right atrial pressure of 8 mmHg. FINDINGS  Left Ventricle: Left ventricular ejection fraction, by estimation, is 55 to 60%. The left ventricle has normal function. The left ventricle has no regional wall motion abnormalities. The left ventricular internal cavity size was normal in size. There is  mild left ventricular hypertrophy. Left ventricular diastolic parameters are consistent with Grade II diastolic dysfunction (pseudonormalization). Right Ventricle: The right ventricular size is normal. No increase in right ventricular wall thickness. Right ventricular systolic function is normal. There is moderately elevated pulmonary artery systolic pressure. The tricuspid regurgitant velocity is 3.23 m/s, and with an assumed right atrial pressure of 10 mmHg, the estimated right ventricular systolic pressure is 36.4 mmHg. Left Atrium: Left atrial size was mildly dilated. Right Atrium: Right atrial size was normal in size. Pericardium: There is no evidence of pericardial effusion. Mitral Valve: The mitral valve is normal in structure. Normal mobility of the mitral valve leaflets. Mild mitral valve regurgitation. No evidence of mitral valve stenosis. MV peak gradient, 7.1 mmHg. The mean mitral valve gradient is 4.0 mmHg. Tricuspid Valve: The tricuspid valve is normal in structure. Tricuspid valve regurgitation is mild . No evidence of tricuspid stenosis. Aortic Valve: The aortic valve is  normal in structure. Aortic valve regurgitation is not visualized. No aortic stenosis is present. Aortic valve mean gradient measures 5.0 mmHg. Aortic valve peak gradient measures 11.3 mmHg. Aortic valve area, by VTI measures 1.64 cm. Pulmonic Valve: The pulmonic valve was normal in structure. Pulmonic valve regurgitation is not visualized. No evidence of pulmonic stenosis. Aorta: The aortic root is normal in size  and structure. Venous: The inferior vena cava is dilated in size with greater than 50% respiratory variability, suggesting right atrial pressure of 8 mmHg. IAS/Shunts: No atrial level shunt detected by color flow Doppler.  LEFT VENTRICLE PLAX 2D LVIDd:         5.82 cm     Diastology LVIDs:         4.27 cm     LV e' lateral:   6.85 cm/s LV PW:         1.12 cm     LV E/e' lateral: 16.1 LV IVS:        0.92 cm     LV e' medial:    4.13 cm/s LVOT diam:     1.90 cm     LV E/e' medial:  26.6 LV SV:         50 LV SV Index:   28 LVOT Area:     2.84 cm  LV Volumes (MOD) LV vol d, MOD A2C: 85.9 ml LV vol d, MOD A4C: 66.4 ml LV vol s, MOD A2C: 51.1 ml LV vol s, MOD A4C: 38.2 ml LV SV MOD A2C:     34.8 ml LV SV MOD A4C:     66.4 ml LV SV MOD BP:      33.7 ml RIGHT VENTRICLE RV Basal diam:  3.45 cm LEFT ATRIUM             Index       RIGHT ATRIUM           Index LA diam:        4.60 cm 2.57 cm/m  RA Area:     14.80 cm LA Vol (A2C):   80.4 ml 44.84 ml/m RA Volume:   34.90 ml  19.46 ml/m LA Vol (A4C):   58.0 ml 32.35 ml/m LA Biplane Vol: 70.6 ml 39.38 ml/m  AORTIC VALVE                    PULMONIC VALVE AV Area (Vmax):    1.91 cm     PV Vmax:       1.19 m/s AV Area (Vmean):   2.03 cm     PV Vmean:      86.500 cm/s AV Area (VTI):     1.64 cm     PV VTI:        0.228 m AV Vmax:           168.00 cm/s  PV Peak grad:  5.7 mmHg AV Vmean:          101.000 cm/s PV Mean grad:  3.0 mmHg AV VTI:            0.306 m AV Peak Grad:      11.3 mmHg AV Mean Grad:      5.0 mmHg LVOT Vmax:         113.00 cm/s LVOT Vmean:         72.200 cm/s LVOT VTI:          0.177 m LVOT/AV VTI ratio: 0.58  AORTA Ao Root diam: 3.00 cm MITRAL VALVE                TRICUSPID VALVE MV Area (PHT): 3.60 cm     TR Peak grad:   41.7 mmHg MV Peak grad:  7.1 mmHg     TR Vmax:        323.00 cm/s MV Mean grad:  4.0 mmHg MV Vmax:  1.33 m/s     SHUNTS MV Vmean:      91.5 cm/s    Systemic VTI:  0.18 m MV Decel Time: 211 msec     Systemic Diam: 1.90 cm MV E velocity: 110.00 cm/s MV A velocity: 91.20 cm/s MV E/A ratio:  1.21 Ida Rogue MD Electronically signed by Ida Rogue MD Signature Date/Time: 02/13/2020/4:55:24 PM    Final     Scheduled Meds: . albuterol  2 puff Inhalation Q6H  . vitamin C  500 mg Oral Daily  . aspirin EC  81 mg Oral Daily  . [START ON 02/15/2020] bumetanide  1 mg Oral Daily  . enoxaparin (LOVENOX) injection  40 mg Subcutaneous Q24H  . ferrous sulfate  325 mg Oral q morning - 10a  . methylPREDNISolone (SOLU-MEDROL) injection  40 mg Intravenous Q12H  . multivitamin with minerals  1 tablet Oral Daily  . pantoprazole  20 mg Oral Daily  . rosuvastatin  10 mg Oral Daily  . sodium chloride flush  3 mL Intravenous Q12H  . zinc sulfate  220 mg Oral Daily   Continuous Infusions: . sodium chloride    . sodium chloride    . sodium chloride    . remdesivir 100 mg in NS 100 mL 100 mg (02/14/20 1053)    Assessment/Plan:  1. COVID-19 pneumonia.  Day 4 of remdesivir today.  Patient with a lot of wheezing today.  Switch Decadron over to Solu-Medrol.  Continue albuterol inhaler.  Give a dose of IV Lasix.  We will hold on disposition today and reevaluate tomorrow.  Can go back on her Bumex tomorrow.  We will repeat a chest x-ray today and get an ultrasound of the lower extremities to rule out DVT.  Patient had a CT scan of the chest that was negative for pulmonary embolism on presentation.  Patient saturating well on room air. 2. 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  Patient has  been off her chemo pill and will have a follow-up outpatient with [facility] to determine whether or not to go back on it 4. Hyperlipidemia unspecified on Crestor 5. Essential hypertension.  Blood pressure stable off hydrochlorothiazide.     Code Status:     Code Status Orders  (From admission, onward)         Start     Ordered   02/11/20 0734  Full code  Continuous        02/11/20 0735        Code Status History    This patient has a current code status but no historical code status.   Advance Care Planning Activity     Family Communication: Spoke with niece Hilda Blades on the phone Disposition Plan: Status is: Inpatient  Dispo: The patient is from: Home              Anticipated d/c is to: Home              Anticipated d/c date is: Potential disposition 02/15/2020              Patient currently wheezing too much and worsening shortness of breath today.  Will finish up remdesivir course while here.  Give a dose of IV Lasix today and reassess lungs tomorrow get chest x-ray today.  Time spent: 28 minutes  [place]

## 2020-02-14 NOTE — Progress Notes (Addendum)
Erroneous note

## 2020-02-14 NOTE — Progress Notes (Signed)
[place] visited pt. per RN suggestion; pt. sitting up on edge of bed when [facility] walked in; shared she came to the hospital last Friday w/COVID.  Pt. lives in [place] w/her niece --> husband and two children have died.  Pt. is currently being treated at [facility] for a cancer in her brain.  Pt. appears to be coping relatively well w/current illness, but became tearful in describing how 'depressing' and lonely it has been to be isolated from visitors; [facility] prayed for pt. at end of visit, asking God for strength and physical healing as well as a renewed sense of God's presence and peace and love; pt. tearfully grateful for [facility] support.  [place] will follow up later this week if possible.

## 2020-02-15 ENCOUNTER — Ambulatory Visit (HOSPITAL_COMMUNITY): Payer: Medicare PPO

## 2020-02-15 LAB — CBC WITH DIFFERENTIAL/PLATELET
Abs Immature Granulocytes: 0.01 10*3/uL (ref 0.00–0.07)
Basophils Absolute: 0 10*3/uL (ref 0.0–0.1)
Basophils Relative: 0 %
Eosinophils Absolute: 0 10*3/uL (ref 0.0–0.5)
Eosinophils Relative: 0 %
HCT: 37.3 % (ref 36.0–46.0)
Hemoglobin: 12.3 g/dL (ref 12.0–15.0)
Immature Granulocytes: 0 %
Lymphocytes Relative: 11 %
Lymphs Abs: 0.5 10*3/uL — ABNORMAL LOW (ref 0.7–4.0)
MCH: 27.3 pg (ref 26.0–34.0)
MCHC: 33 g/dL (ref 30.0–36.0)
MCV: 82.7 fL (ref 80.0–100.0)
Monocytes Absolute: 0.3 10*3/uL (ref 0.1–1.0)
Monocytes Relative: 5 %
Neutro Abs: 3.8 10*3/uL (ref 1.7–7.7)
Neutrophils Relative %: 84 %
Platelets: 208 10*3/uL (ref 150–400)
RBC: 4.51 MIL/uL (ref 3.87–5.11)
RDW: 14.8 % (ref 11.5–15.5)
WBC: 4.6 10*3/uL (ref 4.0–10.5)
nRBC: 0 % (ref 0.0–0.2)

## 2020-02-15 LAB — COMPREHENSIVE METABOLIC PANEL
ALT: 17 U/L (ref 0–44)
AST: 20 U/L (ref 15–41)
Albumin: 3.9 g/dL (ref 3.5–5.0)
Alkaline Phosphatase: 74 U/L (ref 38–126)
Anion gap: 10 (ref 5–15)
BUN: 14 mg/dL (ref 8–23)
CO2: 28 mmol/L (ref 22–32)
Calcium: 8.9 mg/dL (ref 8.9–10.3)
Chloride: 101 mmol/L (ref 98–111)
Creatinine, Ser: 0.83 mg/dL (ref 0.44–1.00)
GFR calc Af Amer: >60 mL/min (ref >60)
GFR calc non Af Amer: >60 mL/min (ref >60)
Glucose, Bld: 136 mg/dL — ABNORMAL HIGH (ref 70–99)
Potassium: 4.4 mmol/L (ref 3.5–5.1)
Sodium: 139 mmol/L (ref 135–145)
Total Bilirubin: 0.5 mg/dL (ref 0.3–1.2)
Total Protein: 6.9 g/dL (ref 6.5–8.1)

## 2020-02-15 LAB — C-REACTIVE PROTEIN: CRP: 0.8 mg/dL (ref <1.0)

## 2020-02-15 LAB — FERRITIN: Ferritin: 55 ng/mL (ref 11–307)

## 2020-02-15 LAB — PHOSPHORUS: Phosphorus: 4 mg/dL (ref 2.5–4.6)

## 2020-02-15 LAB — FIBRIN DERIVATIVES D-DIMER (ARMC ONLY): Fibrin derivatives D-dimer (ARMC): 807.76 ng/mL (FEU) — ABNORMAL HIGH (ref 0.00–499.00)

## 2020-02-15 LAB — MAGNESIUM: Magnesium: 2 mg/dL (ref 1.7–2.4)

## 2020-02-15 NOTE — Care Management Important Message (Signed)
Important Message  Patient Details  Name: Sheila Krueger MRN: [id number] Date of Birth: 1935/05/14   Medicare Important Message Given:  Yes  Initial Medicare IM given by Patient Access Associate on 02/14/2020 at 2:26pm.     Dannette Barbara 02/15/2020, 11:35 AM

## 2020-02-15 NOTE — Progress Notes (Signed)
Pt dressed in own clothes, given discharge instructions, verbalized understanding of all instruction. Pt requested to eat lunch and is "trying to call someone to pick her up."

## 2020-02-15 NOTE — Progress Notes (Signed)
Pt off floor for discharge via wheelchair accompanied by transport.  Pt has all her belongings including cell phone and charger, and 3 bags full of belongins

## 2020-02-15 NOTE — Discharge Summary (Signed)
Physician Discharge Summary  Sheila Krueger JKD:[id number] DOB: 1935/03/15 DOA: 02/11/2020  PCP: Kirk Ruths, MD  Admit date: 02/11/2020 Discharge date: 02/15/2020  Admitted From: Home Disposition:  Home  Recommendations for Outpatient Follow-up:  1. Follow up with PCP in 1-2 weeks 2. Please obtain BMP/CBC in one week 3. Please follow up on the following pending results:None  Home Health:No Equipment/Devices:None Discharge Condition: Stable CODE STATUS: Full Diet recommendation: Heart Healthy / Carb Modified   Brief/Interim Summary: Sheila Krueger is a 85 y.o. 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  Patient tested positive for the COVID-19 virus on 02/10/20.  Patient was fully vaccinated and complete 2 doses in July 2021. Admitted for COVID-19 pneumonia.  Mild hypoxia which resolved quickly.  Patient completed the course of remdesivir while in the hospital and discharged home on 5 more days of steroid.  Patient will follow up with her oncologist and primary care provider and continue with her home meds.  Discharge Diagnoses:  Principal Problem:   Pneumonia due to COVID-19 virus Active Problems:   CNS lymphoma ([place])   Elevated troponin   Hypertension   Wheeze  Discharge Instructions  Discharge Instructions    Diet - low sodium heart healthy   Complete by: As directed    Discharge instructions   Complete by: As directed    It was pleasure taking care of you. Please continue taking Decadron for another 5 days. Keep your self quarantine for 10 more days. Keep yourself well-hydrated and continue taking your supplements. Follow-up with your primary care provider.   Increase activity slowly   Complete by: As directed      Allergies as of 02/15/2020      Reactions   Aspirin Other (See Comments)   Tachycardia   Other Other (See Comments)   Cough malaise       Medication List    STOP taking these medications   hydrochlorothiazide 25 MG tablet Commonly known as: HYDRODIURIL   ibuprofen 200 MG tablet Commonly known as: ADVIL   temozolomide 100 MG capsule Commonly known as: TEMODAR     TAKE these medications   albuterol 108 (90 Base) MCG/ACT inhaler Commonly known as: VENTOLIN HFA Inhale 2 puffs into the lungs every 6 (six) hours.   ascorbic acid 500 MG tablet Commonly known as: VITAMIN C Take 1 tablet (500 mg total) by mouth daily.   bumetanide 1 MG tablet Commonly known as: BUMEX Take 1 mg by mouth daily.   [employer] Melatonin 3 MG Tabs tablet Generic drug: melatonin Take 3 mg by mouth at bedtime as needed.   dexamethasone 6 MG tablet Commonly known as: DECADRON Take 1 tablet (6 mg total) by mouth daily for 7 days. What changed:   medication strength  how much to take  when to take this   ferrous sulfate 325 (65 FE) MG EC tablet Take 1 tablet by mouth every morning.   multivitamin capsule Take 1 capsule by mouth daily.   ondansetron 8 MG tablet Commonly known as: ZOFRAN Take 8 mg by mouth every 8 (eight) hours as needed for nausea.   pantoprazole 20 MG tablet Commonly known as: Protonix Take 1 tablet (20 mg total) by mouth daily.   potassium chloride 10 MEQ tablet Commonly known as: KLOR-CON Take 10 mEq by mouth daily.   rosuvastatin 10 MG tablet Commonly known as: CRESTOR Take 1 tablet by mouth daily.  traZODone 50 MG tablet Commonly known as: DESYREL Take 50 mg by mouth at bedtime as needed for sleep.   Tylenol 8 Hour Arthritis Pain 650 MG CR tablet Generic drug: acetaminophen Take 650 mg by mouth every 8 (eight) hours as needed for pain.   zinc sulfate 220 (50 Zn) MG capsule Take 1 capsule (220 mg total) by mouth daily.       Follow-up Information    Kirk Ruths, MD. Go on 02/23/2020.   Specialty: Internal Medicine Why: @ 2pm Contact information: [address] [facility] [place] [address] [phone]              Allergies  Allergen Reactions  . Aspirin Other (See Comments)    Tachycardia  . Other Other (See Comments)    Cough malaise    Consultations:  None  Procedures/Studies: CT Angio Chest PE W/Cm &/Or Wo Cm  Result Date: 02/11/2020 CLINICAL DATA:  Shortness of breath, COVID, chest pain, cough EXAM: CT ANGIOGRAPHY CHEST WITH CONTRAST TECHNIQUE: Multidetector CT imaging of the chest was performed using the standard protocol during bolus administration of intravenous contrast. Multiplanar CT image reconstructions and MIPs were obtained to evaluate the vascular anatomy. CONTRAST:  75mL OMNIPAQUE IOHEXOL 350 MG/ML SOLN COMPARISON:  Chest radiograph dated 02/11/2020 FINDINGS: Cardiovascular: Satisfactory opacification the bilateral pulmonary arteries to the segmental level. No evidence of pulmonary embolism. Study is not tailored for evaluation of the thoracic aorta,. No evidence of thoracic aortic aneurysm. Atherosclerotic calcifications of the aortic arch. Heart is top-normal in size.  No pericardial effusion. Coronary atherosclerosis of the LAD. Mediastinum/Nodes: No suspicious mediastinal lymphadenopathy. Lungs/Pleura: Evaluation of the lung parenchyma is constrained by respiratory motion. Within that constraint, there are no suspicious pulmonary nodules. Minimal dependent opacities in the posterior upper lobes (series 6/image 26), possibly atelectasis or mild infection. No focal consolidation. Trace left pleural effusion. No pneumothorax. Upper Abdomen: Visualized upper abdomen is grossly unremarkable. Musculoskeletal: Degenerative changes of the visualized thoracolumbar spine. Review of the MIP images confirms the above findings. IMPRESSION: No evidence of pulmonary embolism. Minimal dependent atelectasis in the lungs bilaterally, less likely mild infection. Trace left pleural effusion. Aortic Atherosclerosis (ICD10-I70.0). Electronically  Signed   By: Julian Hy M.D.   On: 02/11/2020 06:42   US Venous Img Lower Bilateral (DVT)  Result Date: 02/14/2020 CLINICAL DATA:  Shortness of breath. EXAM: BILATERAL LOWER EXTREMITY VENOUS DOPPLER ULTRASOUND TECHNIQUE: Gray-scale sonography with compression, as well as color and duplex ultrasound, were performed to evaluate the deep venous system(s) from the level of the common femoral vein through the popliteal and proximal calf veins. COMPARISON:  None. FINDINGS: VENOUS Normal compressibility of the common femoral, superficial femoral, and popliteal veins, as well as the visualized calf veins. Visualized portions of profunda femoral vein and great saphenous vein unremarkable. No filling defects to suggest DVT on grayscale or color Doppler imaging. Doppler waveforms show normal direction of venous flow, normal respiratory plasticity and response to augmentation. OTHER In the right inguinal region, there is a 3.5 x 1.6 x 2.6 cm complex, septated collection within the subcutaneous soft tissues. There does not appear to be significant internal color Doppler flow within this mass. Limitations: none IMPRESSION: 1. No DVT. 2. Complex collection measuring approximately 3.5 cm in the right inguinal region. Differential considerations include a hematoma/seroma, thrombosed pseudoaneurysm, versus a necrotic lymph node. Correlation with physical exam and history is recommended. Electronically Signed   By: Jamie Kato.D.  On: 02/14/2020 17:22   DG Chest Port 1 View  Result Date: 02/14/2020 CLINICAL DATA:  History of COVID pneumonia EXAM: PORTABLE CHEST 1 VIEW COMPARISON:  02/11/2020, 10/10/2019 CT 02/11/2020 FINDINGS: Chronic elevation right diaphragm. No focal consolidation or effusion. Enlarged cardiomediastinal silhouette with aortic atherosclerosis. No pneumothorax IMPRESSION: No active cardiopulmonary disease. Cardiomegaly. Electronically Signed   By: Donavan Foil M.D.   On: 02/14/2020 17:21    DG Chest Port 1 View  Result Date: 02/11/2020 CLINICAL DATA:  COVID-19 EXAM: PORTABLE CHEST 1 VIEW COMPARISON:  10/10/2019 FINDINGS: Hazy bilateral opacities. Cardiomediastinal contours are normal. No pleural effusion. Right basilar atelectasis. IMPRESSION: Hazy bilateral opacities, likely multifocal infection. Electronically Signed   By: Ulyses Jarred M.D.   On: 02/11/2020 04:29   ECHOCARDIOGRAM COMPLETE  Result Date: 02/13/2020    ECHOCARDIOGRAM REPORT   Patient Name:   Adalberto Ill Date of Exam: 02/13/2020 Medical Rec #:  [id number]          Height:       62.0 in Accession #:    [id number]         Weight:       172.0 lb Date of Birth:  [birth year]         BSA:          1.793 m Patient Age:    84 years           BP:           124/69 mmHg Patient Gender: F                  HR:           96 bpm. Exam Location:  [facility] Procedure: 2D Echo, Color Doppler and Cardiac Doppler Indications:     R06.00 Dyspnea  History:         Patient has no prior history of Echocardiogram examinations.                  Risk Factors:Hypertension. Pt tested positive for COVID-19 on                  02/11/20.  Sonographer:     Charmayne Sheer RDCS (AE) Referring Phys:  [id number] Kate Sable Diagnosing Phys: Ida Rogue MD IMPRESSIONS  1. Left ventricular ejection fraction, by estimation, is 55%. The left ventricle has normal function. The left ventricle has no regional wall motion abnormalities. There is mild left ventricular hypertrophy. Left ventricular diastolic parameters are consistent with Grade II diastolic dysfunction (pseudonormalization).  2. Right ventricular systolic function is normal. The right ventricular size is normal. There is moderately elevated pulmonary artery systolic pressure. The estimated right ventricular systolic pressure is 73.4 mmHg.  3. Left atrial size was mildly dilated.  4. Mild mitral valve regurgitation.  5. The inferior vena cava is dilated in size with >50% respiratory variability, suggesting  right atrial pressure of 8 mmHg. FINDINGS  Left Ventricle: Left ventricular ejection fraction, by estimation, is 55 to 60%. The left ventricle has normal function. The left ventricle has no regional wall motion abnormalities. The left ventricular internal cavity size was normal in size. There is  mild left ventricular hypertrophy. Left ventricular diastolic parameters are consistent with Grade II diastolic dysfunction (pseudonormalization). Right Ventricle: The right ventricular size is normal. No increase in right ventricular wall thickness. Right ventricular systolic function is normal. There is moderately elevated pulmonary artery systolic pressure. The tricuspid regurgitant velocity is 3.23 m/s, and with an assumed right  atrial pressure of 10 mmHg, the estimated right ventricular systolic pressure is 16.0 mmHg. Left Atrium: Left atrial size was mildly dilated. Right Atrium: Right atrial size was normal in size. Pericardium: There is no evidence of pericardial effusion. Mitral Valve: The mitral valve is normal in structure. Normal mobility of the mitral valve leaflets. Mild mitral valve regurgitation. No evidence of mitral valve stenosis. MV peak gradient, 7.1 mmHg. The mean mitral valve gradient is 4.0 mmHg. Tricuspid Valve: The tricuspid valve is normal in structure. Tricuspid valve regurgitation is mild . No evidence of tricuspid stenosis. Aortic Valve: The aortic valve is normal in structure. Aortic valve regurgitation is not visualized. No aortic stenosis is present. Aortic valve mean gradient measures 5.0 mmHg. Aortic valve peak gradient measures 11.3 mmHg. Aortic valve area, by VTI measures 1.64 cm. Pulmonic Valve: The pulmonic valve was normal in structure. Pulmonic valve regurgitation is not visualized. No evidence of pulmonic stenosis. Aorta: The aortic root is normal in size and structure. Venous: The inferior vena cava is dilated in size with greater than 50% respiratory variability, suggesting right  atrial pressure of 8 mmHg. IAS/Shunts: No atrial level shunt detected by color flow Doppler.  LEFT VENTRICLE PLAX 2D LVIDd:         5.82 cm     Diastology LVIDs:         4.27 cm     LV e' lateral:   6.85 cm/s LV PW:         1.12 cm     LV E/e' lateral: 16.1 LV IVS:        0.92 cm     LV e' medial:    4.13 cm/s LVOT diam:     1.90 cm     LV E/e' medial:  26.6 LV SV:         50 LV SV Index:   28 LVOT Area:     2.84 cm  LV Volumes (MOD) LV vol d, MOD A2C: 85.9 ml LV vol d, MOD A4C: 66.4 ml LV vol s, MOD A2C: 51.1 ml LV vol s, MOD A4C: 38.2 ml LV SV MOD A2C:     34.8 ml LV SV MOD A4C:     66.4 ml LV SV MOD BP:      33.7 ml RIGHT VENTRICLE RV Basal diam:  3.45 cm LEFT ATRIUM             Index       RIGHT ATRIUM           Index LA diam:        4.60 cm 2.57 cm/m  RA Area:     14.80 cm LA Vol (A2C):   80.4 ml 44.84 ml/m RA Volume:   34.90 ml  19.46 ml/m LA Vol (A4C):   58.0 ml 32.35 ml/m LA Biplane Vol: 70.6 ml 39.38 ml/m  AORTIC VALVE                    PULMONIC VALVE AV Area (Vmax):    1.91 cm     PV Vmax:       1.19 m/s AV Area (Vmean):   2.03 cm     PV Vmean:      86.500 cm/s AV Area (VTI):     1.64 cm     PV VTI:        0.228 m AV Vmax:           168.00 cm/s  PV Peak grad:  5.7 mmHg AV Vmean:          101.000 cm/s PV Mean grad:  3.0 mmHg AV VTI:            0.306 m AV Peak Grad:      11.3 mmHg AV Mean Grad:      5.0 mmHg LVOT Vmax:         113.00 cm/s LVOT Vmean:        72.200 cm/s LVOT VTI:          0.177 m LVOT/AV VTI ratio: 0.58  AORTA Ao Root diam: 3.00 cm MITRAL VALVE                TRICUSPID VALVE MV Area (PHT): 3.60 cm     TR Peak grad:   41.7 mmHg MV Peak grad:  7.1 mmHg     TR Vmax:        323.00 cm/s MV Mean grad:  4.0 mmHg MV Vmax:       1.33 m/s     SHUNTS MV Vmean:      91.5 cm/s    Systemic VTI:  0.18 m MV Decel Time: 211 msec     Systemic Diam: 1.90 cm MV E velocity: 110.00 cm/s MV A velocity: 91.20 cm/s MV E/A ratio:  1.21 Ida Rogue MD Electronically signed by Ida Rogue MD Signature  Date/Time: 02/13/2020/4:55:24 PM    Final     Subjective: Pt. Was feeling better when seen today.No new complaints.  Discharge Exam: Vitals:   02/15/20 0332 02/15/20 0755  BP: 125/61 120/78  Pulse: 84 80  Resp: 17 18  Temp: 98.3 F (36.8 C) 98.2 F (36.8 C)  SpO2: 93% 94%   Vitals:   02/14/20 1932 02/14/20 2347 02/15/20 0332 02/15/20 0755  BP: 124/69 113/67 125/61 120/78  Pulse: 83 80 84 80  Resp: 17 16 17 18   Temp: 99.1 F (37.3 C) 98.3 F (36.8 C) 98.3 F (36.8 C) 98.2 F (36.8 C)  TempSrc: Oral Oral  Oral  SpO2: 92% 93% 93% 94%  Weight:        General: Pt is alert, awake, not in acute distress Cardiovascular: RRR, S1/S2 +, no rubs, no gallops Respiratory: CTA bilaterally, no wheezing, no rhonchi Abdominal: Soft, NT, ND, bowel sounds + Extremities: no edema, no cyanosis   The results of significant diagnostics from this hospitalization (including imaging, microbiology, ancillary and laboratory) are listed below for reference.    Microbiology: No results found for this or any previous visit (from the past 240 hour(s)).   Labs: BNP (last 3 results) Recent Labs    10/10/19 2120 02/11/20 0506  BNP 68.0 419.6*   Basic Metabolic Panel: Recent Labs  Lab 02/10/20 2012 02/12/20 0428 02/13/20 0414 02/14/20 0609 02/15/20 0556  NA 138 142 141 141 139  K 3.7 3.5 3.5 3.3* 4.4  CL 102 107 104 103 101  CO2 24 26 27 28 28   GLUCOSE 115* 105* 94 94 136*  BUN 13 11 14 13 14   CREATININE 0.66 0.55 0.67 0.64 0.83  CALCIUM 9.3 8.8* 8.8* 8.5* 8.9  MG  --  2.0 1.9 1.9 2.0  PHOS  --  3.8 2.9 3.3 4.0   Liver Function Tests: Recent Labs  Lab 02/10/20 2012 02/12/20 0428 02/13/20 0414 02/14/20 0609 02/15/20 0556  AST 23 21 20 20 20   ALT 17 15 14 15 17   ALKPHOS 81 72 60 58 74  BILITOT 0.7 0.6 0.6 0.6 0.5  PROT  7.7 6.8 6.5 6.3* 6.9  ALBUMIN 4.4 3.7 3.6 3.6 3.9   No results for input(s): LIPASE, AMYLASE in the last 168 hours. No results for input(s): AMMONIA in  the last 168 hours. CBC: Recent Labs  Lab 02/10/20 2012 02/12/20 0428 02/13/20 0414 02/14/20 0609 02/15/20 0556  WBC 4.0 2.3* 3.8* 4.5 4.6  NEUTROABS  --  1.1* 2.3 2.8 3.8  HGB 11.6* 11.8* 11.1* 11.3* 12.3  HCT 37.2 38.1 34.0* 35.1* 37.3  MCV 86.3 87.0 84.0 84.0 82.7  PLT 202 189 200 196 208   Cardiac Enzymes: No results for input(s): CKTOTAL, CKMB, CKMBINDEX, TROPONINI in the last 168 hours. BNP: Invalid input(s): POCBNP CBG: No results for input(s): GLUCAP in the last 168 hours. D-Dimer No results for input(s): DDIMER in the last 72 hours. Hgb A1c No results for input(s): HGBA1C in the last 72 hours. Lipid Profile No results for input(s): CHOL, HDL, LDLCALC, TRIG, CHOLHDL, LDLDIRECT in the last 72 hours. Thyroid function studies No results for input(s): TSH, T4TOTAL, T3FREE, THYROIDAB in the last 72 hours.  Invalid input(s): FREET3 Anemia work up Recent Labs    02/14/20 0609 02/15/20 0556  FERRITIN 43 55   Urinalysis    Component Value Date/Time   COLORURINE STRAW (A) 10/10/2019 2327   APPEARANCEUR CLEAR (A) 10/10/2019 2327   LABSPEC 1.005 10/10/2019 2327   PHURINE 6.0 10/10/2019 [place] 10/10/2019 2327   HGBUR NEGATIVE 10/10/2019 2327   BILIRUBINUR NEGATIVE 10/10/2019 2327   KETONESUR NEGATIVE 10/10/2019 2327   PROTEINUR NEGATIVE 10/10/2019 2327   NITRITE NEGATIVE 10/10/2019 2327   LEUKOCYTESUR NEGATIVE 10/10/2019 2327   Sepsis Labs Invalid input(s): PROCALCITONIN,  WBC,  LACTICIDVEN Microbiology No results found for this or any previous visit (from the past 240 hour(s)).  Time coordinating discharge: Over 30 minutes  SIGNED:  Lorella Nimrod, MD  [facility] 02/15/2020, 6:50 PM  If 7PM-7AM, please contact night-coverage [email or web address]  This record has been created using Systems analyst. Errors have been sought and corrected,but may not always be located. Such creation errors do not reflect on the standard of  care.

## 2020-02-16 ENCOUNTER — Other Ambulatory Visit: Payer: Self-pay

## 2020-02-16 ENCOUNTER — Inpatient Hospital Stay
Admission: EM | Admit: 2020-02-16 | Discharge: 2020-02-19 | DRG: 178 | Disposition: A | Discharge status: Home / Self Care | Payer: Medicare PPO | Attending: Internal Medicine | Admitting: Internal Medicine

## 2020-02-16 ENCOUNTER — Emergency Department: Payer: Medicare PPO

## 2020-02-16 ENCOUNTER — Encounter: Payer: Self-pay | Admitting: Emergency Medicine

## 2020-02-16 DIAGNOSIS — Z8249 Family history of ischemic heart disease and other diseases of the circulatory system: Secondary | ICD-10-CM

## 2020-02-16 DIAGNOSIS — I10 Essential (primary) hypertension: Secondary | ICD-10-CM | POA: Diagnosis present

## 2020-02-16 DIAGNOSIS — R509 Fever, unspecified: Secondary | ICD-10-CM | POA: Diagnosis present

## 2020-02-16 DIAGNOSIS — U071 COVID-19: Secondary | ICD-10-CM | POA: Diagnosis not present

## 2020-02-16 DIAGNOSIS — R6510 Systemic inflammatory response syndrome (SIRS) of non-infectious origin without acute organ dysfunction: Secondary | ICD-10-CM | POA: Diagnosis present

## 2020-02-16 DIAGNOSIS — R0902 Hypoxemia: Secondary | ICD-10-CM | POA: Diagnosis present

## 2020-02-16 DIAGNOSIS — E785 Hyperlipidemia, unspecified: Secondary | ICD-10-CM | POA: Diagnosis present

## 2020-02-16 DIAGNOSIS — D496 Neoplasm of unspecified behavior of brain: Secondary | ICD-10-CM | POA: Diagnosis present

## 2020-02-16 DIAGNOSIS — I119 Hypertensive heart disease without heart failure: Secondary | ICD-10-CM | POA: Diagnosis present

## 2020-02-16 DIAGNOSIS — Z886 Allergy status to analgesic agent status: Secondary | ICD-10-CM

## 2020-02-16 DIAGNOSIS — J9811 Atelectasis: Secondary | ICD-10-CM | POA: Diagnosis present

## 2020-02-16 DIAGNOSIS — Z9981 Dependence on supplemental oxygen: Secondary | ICD-10-CM

## 2020-02-16 DIAGNOSIS — M1990 Unspecified osteoarthritis, unspecified site: Secondary | ICD-10-CM | POA: Diagnosis present

## 2020-02-16 DIAGNOSIS — K219 Gastro-esophageal reflux disease without esophagitis: Secondary | ICD-10-CM | POA: Diagnosis present

## 2020-02-16 DIAGNOSIS — R059 Cough, unspecified: Secondary | ICD-10-CM | POA: Diagnosis present

## 2020-02-16 DIAGNOSIS — Z8572 Personal history of non-Hodgkin lymphomas: Secondary | ICD-10-CM

## 2020-02-16 DIAGNOSIS — Z79899 Other long term (current) drug therapy: Secondary | ICD-10-CM

## 2020-02-16 LAB — BASIC METABOLIC PANEL
Anion gap: 10 (ref 5–15)
BUN: 18 mg/dL (ref 8–23)
CO2: 28 mmol/L (ref 22–32)
Calcium: 8.8 mg/dL — ABNORMAL LOW (ref 8.9–10.3)
Chloride: 99 mmol/L (ref 98–111)
Creatinine, Ser: 0.83 mg/dL (ref 0.44–1.00)
GFR calc Af Amer: >60 mL/min (ref >60)
GFR calc non Af Amer: >60 mL/min (ref >60)
Glucose, Bld: 112 mg/dL — ABNORMAL HIGH (ref 70–99)
Potassium: 4 mmol/L (ref 3.5–5.1)
Sodium: 137 mmol/L (ref 135–145)

## 2020-02-16 LAB — CBC
HCT: 39.7 % (ref 36.0–46.0)
Hemoglobin: 12.5 g/dL (ref 12.0–15.0)
MCH: 26.8 pg (ref 26.0–34.0)
MCHC: 31.5 g/dL (ref 30.0–36.0)
MCV: 85 fL (ref 80.0–100.0)
Platelets: 204 10*3/uL (ref 150–400)
RBC: 4.67 MIL/uL (ref 3.87–5.11)
RDW: 15.2 % (ref 11.5–15.5)
WBC: 5.2 10*3/uL (ref 4.0–10.5)
nRBC: 0 % (ref 0.0–0.2)

## 2020-02-16 LAB — TROPONIN I (HIGH SENSITIVITY): Troponin I (High Sensitivity): 72 ng/L — ABNORMAL HIGH (ref <18)

## 2020-02-16 MED ORDER — SODIUM CHLORIDE 0.9 % IV SOLN
500.0000 mg | Freq: Once | INTRAVENOUS | Status: AC
  Administered 2020-02-17: 500 mg via INTRAVENOUS
  Filled 2020-02-16: qty 500

## 2020-02-16 MED ORDER — DEXAMETHASONE SODIUM PHOSPHATE 10 MG/ML IJ SOLN
10.0000 mg | Freq: Once | INTRAMUSCULAR | Status: AC
  Administered 2020-02-17: 10 mg via INTRAVENOUS
  Filled 2020-02-16: qty 1

## 2020-02-16 MED ORDER — SODIUM CHLORIDE 0.9 % IV SOLN
1.0000 g | Freq: Once | INTRAVENOUS | Status: AC
  Administered 2020-02-17: 1 g via INTRAVENOUS
  Filled 2020-02-16: qty 10

## 2020-02-16 NOTE — ED Triage Notes (Signed)
Pt in via [facility] from home, reports worsening weakness, chills, decreased appetite.  Low grade fever noted, other vitals WDL.  Pt is Covid+.  NAD noted at this time.

## 2020-02-16 NOTE — ED Provider Notes (Signed)
[facility] Emergency Department Provider Note  ____________________________________________   First MD Initiated Contact with Patient 02/16/20 2320     (approximate)  I have reviewed the triage vital signs and the nursing notes.   HISTORY  Chief Complaint Fever   HPI Sheila Krueger is a 85 y.o. 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  On my arrival to the room patient's oxygen saturation 89% on room air.  Maximum O2 saturation 91%.        Past Medical History:  Diagnosis Date  . 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 Arthritis   . CNS lymphoma ([place])   . Hypertension     Patient Active Problem List   Diagnosis Date Noted  . Wheeze   . Hyperlipidemia   . Pneumonia due to COVID-19 virus 02/11/2020  . Elevated troponin 02/11/2020  . Hypertension   . SOB (shortness of breath)   . Goals of care, counseling/discussion 07/12/2019  . CNS lymphoma ([place]) 07/12/2019    History reviewed. No pertinent surgical history.  Prior to Admission medications   Medication Sig Start Date End Date Taking? Authorizing Provider  acetaminophen (TYLENOL 8 HOUR ARTHRITIS PAIN) 650 MG CR tablet Take 650 mg by mouth every 8 (eight) hours as needed for pain.    [provider]  albuterol (VENTOLIN HFA) 108 (90 Base) MCG/ACT inhaler Inhale 2 puffs into the lungs every 6 (six) hours. 02/13/20   Loletha Grayer, MD  ascorbic acid (VITAMIN C) 500 MG tablet Take 1 tablet (500 mg total) by mouth daily. 02/14/20   Wieting, Richard, MD  bumetanide (BUMEX) 1 MG tablet Take 1 mg by mouth daily. 01/27/20   [provider]  [employer] MELATONIN 3 MG TABS tablet Take 3 mg by mouth at bedtime as needed.  11/29/19   [provider]   dexamethasone (DECADRON) 6 MG tablet Take 1 tablet (6 mg total) by mouth daily for 7 days. 02/14/20 02/21/20  Loletha Grayer, MD  ferrous sulfate 325 (65 FE) MG EC tablet Take 1 tablet by mouth every morning. 01/20/20   [provider]  Multiple Vitamin (MULTIVITAMIN) capsule Take 1 capsule by mouth daily.    [provider]  ondansetron (ZOFRAN) 8 MG tablet Take 8 mg by mouth every 8 (eight) hours as needed for nausea. 12/28/19   [provider]  pantoprazole (PROTONIX) 20 MG tablet Take 1 tablet (20 mg total) by mouth daily. 07/11/19   Sindy Guadeloupe, MD  potassium chloride (KLOR-CON) 10 MEQ tablet Take 10 mEq by mouth daily. 01/27/20   [provider]  rosuvastatin (CRESTOR) 10 MG tablet Take 1 tablet by mouth daily. 11/30/19   [provider]  traZODone (DESYREL) 50 MG tablet Take 50 mg by mouth at bedtime as needed for sleep. 01/15/20   [provider]  zinc sulfate 220 (50 Zn) MG capsule Take 1 capsule (220 mg total) by mouth daily. 02/14/20   Loletha Grayer, MD    Allergies Aspirin  Family History  Problem Relation Age of Onset  . Hypertension Sister     Social History Social History   Tobacco Use  . Smoking status: Never Smoker  . Smokeless tobacco: Never Used  Vaping Use  . Vaping Use: Never used  Substance Use Topics  . Alcohol  use: Not Currently  . Drug use: Not Currently    Review of Systems Constitutional: Positive for fever/chills Eyes: No visual changes. ENT: No sore throat. Cardiovascular: Denies chest pain. Respiratory: Positive for shortness of breath and cough. Gastrointestinal: No abdominal pain.  No nausea, no vomiting.  No diarrhea.  No constipation. Genitourinary: Negative for dysuria. Musculoskeletal: Negative for neck pain.  Negative for back pain. Integumentary: Negative for rash. Neurological: Negative for headaches, focal weakness or  numbness.   ____________________________________________   PHYSICAL EXAM:  VITAL SIGNS: ED Triage Vitals  Enc Vitals Group     BP 02/16/20 1643 137/68     Pulse Rate 02/16/20 1643 97     Resp 02/16/20 1643 20     Temp 02/16/20 1643 (!) 100.7 F (38.2 C)     Temp Source 02/16/20 1643 Oral     SpO2 02/16/20 1643 98 %     Weight 02/16/20 1644 82.6 kg (182 lb)     Height 02/16/20 1644 1.575 m (5\' 2" )     Head Circumference --      Peak Flow --      Pain Score 02/16/20 1644 0     Pain Loc --      Pain Edu? --      Excl. in [place]? --     Constitutional: Alert and oriented.  Ill-appearing Eyes: Conjunctivae are normal.  Head: Atraumatic. Mouth/Throat: Patient is wearing a mask. Neck: No stridor.  No meningeal signs.   Cardiovascular: Normal rate, regular rhythm. Good peripheral circulation. Grossly normal heart sounds. Respiratory: Normal respiratory effort.  No retractions.  Diffuse rhonchi Gastrointestinal: Soft and nontender. No distention.  Musculoskeletal: No lower extremity tenderness nor edema. No gross deformities of extremities. Neurologic:  Normal speech and language. No gross focal neurologic deficits are appreciated.  Skin:  Skin is warm, dry and intact. Psychiatric: Mood and affect are normal. Speech and behavior are normal.  ____________________________________________   LABS (all labs ordered are listed, but only abnormal results are displayed)  Labs Reviewed  BASIC METABOLIC PANEL - Abnormal; Notable for the following components:      Result Value   Glucose, Bld 112 (*)    Calcium 8.8 (*)    All other components within normal limits  CBC  URINALYSIS, COMPLETE (UACMP) WITH MICROSCOPIC  CBG MONITORING, ED  TROPONIN I (HIGH SENSITIVITY)   ____________________________________________  EKG ED ECG REPORT I, [place] N Yovany Clock, the attending physician, personally viewed and interpreted this ECG.   Date: 02/16/2020  EKG Time: 4:54 PM  Rate: 101  Rhythm:  Sinus tachycardia  Axis: Normal  Intervals: Normal  ST&T Change: None   ____________________________________________  RADIOLOGY I, [place] N Tennelle Taflinger, personally viewed and evaluated these images (plain radiographs) as part of my medical decision making, as well as reviewing the written report by the radiologist.  ED MD interpretation: No acute cardiopulmonary abnormality noted on x-ray cardiomegaly and stable lung base atelectasis.  Official radiology report(s): DG Chest 2 View  Result Date: 02/16/2020 CLINICAL DATA:  85 year old female positive COVID-19. Worsening symptoms. Low-grade fever. EXAM: CHEST - 2 VIEW COMPARISON:  Chest CTA 02/11/2020 and earlier. FINDINGS: Stable cardiomegaly and elevation of the right hemidiaphragm. Stable trachea. No pneumothorax, pulmonary edema, pleural effusion or consolidation. Streaky right lung base opacity has not significantly changed and atelectasis is favored as per the recent CTA. No acute pulmonary opacity identified. Stable visualized osseous structures. There are increased gas-filled bowel loops in the upper abdomen. No pneumoperitoneum. IMPRESSION: 1. No  acute cardiopulmonary abnormality. Cardiomegaly and stable lung base atelectasis. 2. Increased gas-filled bowel loops in the visible upper abdomen. Recommend follow-up abdominal radiographs. Electronically Signed   By: Genevie Ann M.D.   On: 02/16/2020 17:28     Procedures   ____________________________________________   INITIAL IMPRESSION / MDM / ASSESSMENT AND PLAN / ED COURSE  As part of my medical decision making, I reviewed the following data within the electronic MEDICAL RECORD NUMBER   85 year old female with above-stated history and physical exam differential diagnosis including but not limited to Covid pneumonia, pulmonary emboli however patient had a CT angiogram performed on 02/11/2020 that was negative which is reassuring.  We will however perform repeat CT today given persistent hypoxia as  patient's oxygen saturation on room air as low as 89% at bedside.  Also consider the possibility of superimposed bacterial pneumonia and as such patient given IV ceftriaxone and azithromycin.  Patient discussed with Dr. Sidney Ace for hospital admission for further evaluation and management.   ____________________________________________  FINAL CLINICAL IMPRESSION(S) / ED DIAGNOSES  Final diagnoses:  COVID-19 virus infection     MEDICATIONS GIVEN DURING THIS VISIT:  Medications  dexamethasone (DECADRON) injection 10 mg (has no administration in time range)  cefTRIAXone (ROCEPHIN) 1 g in sodium chloride 0.9 % 100 mL IVPB (has no administration in time range)  azithromycin (ZITHROMAX) 500 mg in sodium chloride 0.9 % 250 mL IVPB (has no administration in time range)     ED Discharge Orders    None      *Please note:  Noe Maressa Apollo was evaluated in Emergency Department on 02/16/2020 for the symptoms described in the history of present illness. She was evaluated in the context of the global COVID-19 pandemic, which necessitated consideration that the patient might be at risk for infection with the SARS-CoV-2 virus that causes COVID-19. Institutional protocols and algorithms that pertain to the evaluation of patients at risk for COVID-19 are in a state of rapid change based on information released by regulatory bodies including the CDC and federal and state organizations. These policies and algorithms were followed during the patient's care in the ED.  Some ED evaluations and interventions may be delayed as a result of limited staffing during and after the pandemic.*  Note:  This document was prepared using Dragon voice recognition software and may include unintentional dictation errors.   Gregor Hams, MD 02/17/20 [phone]

## 2020-02-16 NOTE — ED Triage Notes (Signed)
Pt in via EMS from home. Pt tested + for COVID last Friday and continues to have sx's. 131/64, 95 %RA, 97HR. Pt has brain tumor currently. Pt also HOH. Pt also with cough

## 2020-02-17 ENCOUNTER — Encounter: Payer: Self-pay | Admitting: Family Medicine

## 2020-02-17 ENCOUNTER — Inpatient Hospital Stay: Payer: Medicare PPO

## 2020-02-17 DIAGNOSIS — K219 Gastro-esophageal reflux disease without esophagitis: Secondary | ICD-10-CM | POA: Diagnosis present

## 2020-02-17 DIAGNOSIS — D496 Neoplasm of unspecified behavior of brain: Secondary | ICD-10-CM | POA: Diagnosis present

## 2020-02-17 DIAGNOSIS — J069 Acute upper respiratory infection, unspecified: Secondary | ICD-10-CM | POA: Diagnosis not present

## 2020-02-17 DIAGNOSIS — U071 COVID-19: Secondary | ICD-10-CM | POA: Diagnosis present

## 2020-02-17 DIAGNOSIS — Z9981 Dependence on supplemental oxygen: Secondary | ICD-10-CM | POA: Diagnosis not present

## 2020-02-17 DIAGNOSIS — R059 Cough, unspecified: Secondary | ICD-10-CM | POA: Diagnosis present

## 2020-02-17 DIAGNOSIS — J9811 Atelectasis: Secondary | ICD-10-CM | POA: Diagnosis present

## 2020-02-17 DIAGNOSIS — I10 Essential (primary) hypertension: Secondary | ICD-10-CM

## 2020-02-17 DIAGNOSIS — Z8249 Family history of ischemic heart disease and other diseases of the circulatory system: Secondary | ICD-10-CM | POA: Diagnosis not present

## 2020-02-17 DIAGNOSIS — Z79899 Other long term (current) drug therapy: Secondary | ICD-10-CM | POA: Diagnosis not present

## 2020-02-17 DIAGNOSIS — R0902 Hypoxemia: Secondary | ICD-10-CM | POA: Diagnosis present

## 2020-02-17 DIAGNOSIS — M1990 Unspecified osteoarthritis, unspecified site: Secondary | ICD-10-CM | POA: Diagnosis present

## 2020-02-17 DIAGNOSIS — E785 Hyperlipidemia, unspecified: Secondary | ICD-10-CM | POA: Diagnosis present

## 2020-02-17 DIAGNOSIS — J1282 Pneumonia due to coronavirus disease 2019: Secondary | ICD-10-CM

## 2020-02-17 DIAGNOSIS — R6510 Systemic inflammatory response syndrome (SIRS) of non-infectious origin without acute organ dysfunction: Secondary | ICD-10-CM | POA: Diagnosis present

## 2020-02-17 DIAGNOSIS — J9601 Acute respiratory failure with hypoxia: Secondary | ICD-10-CM | POA: Diagnosis not present

## 2020-02-17 DIAGNOSIS — R05 Cough: Secondary | ICD-10-CM | POA: Diagnosis not present

## 2020-02-17 DIAGNOSIS — I119 Hypertensive heart disease without heart failure: Secondary | ICD-10-CM | POA: Diagnosis present

## 2020-02-17 DIAGNOSIS — Z886 Allergy status to analgesic agent status: Secondary | ICD-10-CM | POA: Diagnosis not present

## 2020-02-17 DIAGNOSIS — R509 Fever, unspecified: Secondary | ICD-10-CM | POA: Diagnosis present

## 2020-02-17 DIAGNOSIS — Z8572 Personal history of non-Hodgkin lymphomas: Secondary | ICD-10-CM | POA: Diagnosis not present

## 2020-02-17 LAB — URINALYSIS, COMPLETE (UACMP) WITH MICROSCOPIC
Bacteria, UA: NONE SEEN
Bilirubin Urine: NEGATIVE
Glucose, UA: NEGATIVE mg/dL
Hgb urine dipstick: NEGATIVE
Ketones, ur: NEGATIVE mg/dL
Leukocytes,Ua: NEGATIVE
Nitrite: NEGATIVE
Protein, ur: NEGATIVE mg/dL
Specific Gravity, Urine: 1.038 — ABNORMAL HIGH (ref 1.005–1.030)
pH: 6 (ref 5.0–8.0)

## 2020-02-17 LAB — PROCALCITONIN: Procalcitonin: <0.1 ng/mL

## 2020-02-17 LAB — C-REACTIVE PROTEIN: CRP: 2.7 mg/dL — ABNORMAL HIGH (ref <1.0)

## 2020-02-17 LAB — COMPREHENSIVE METABOLIC PANEL
ALT: 16 U/L (ref 0–44)
AST: 29 U/L (ref 15–41)
Albumin: 3.7 g/dL (ref 3.5–5.0)
Alkaline Phosphatase: 71 U/L (ref 38–126)
Anion gap: 12 (ref 5–15)
BUN: 18 mg/dL (ref 8–23)
CO2: 25 mmol/L (ref 22–32)
Calcium: 8.2 mg/dL — ABNORMAL LOW (ref 8.9–10.3)
Chloride: 99 mmol/L (ref 98–111)
Creatinine, Ser: 0.69 mg/dL (ref 0.44–1.00)
GFR calc Af Amer: >60 mL/min (ref >60)
GFR calc non Af Amer: >60 mL/min (ref >60)
Glucose, Bld: 191 mg/dL — ABNORMAL HIGH (ref 70–99)
Potassium: 3.8 mmol/L (ref 3.5–5.1)
Sodium: 136 mmol/L (ref 135–145)
Total Bilirubin: 0.7 mg/dL (ref 0.3–1.2)
Total Protein: 7 g/dL (ref 6.5–8.1)

## 2020-02-17 LAB — FIBRIN DERIVATIVES D-DIMER (ARMC ONLY): Fibrin derivatives D-dimer (ARMC): 1065.41 ng/mL (FEU) — ABNORMAL HIGH (ref 0.00–499.00)

## 2020-02-17 LAB — BRAIN NATRIURETIC PEPTIDE: B Natriuretic Peptide: 178.7 pg/mL — ABNORMAL HIGH (ref 0.0–100.0)

## 2020-02-17 LAB — LACTATE DEHYDROGENASE: LDH: 200 U/L — ABNORMAL HIGH (ref 98–192)

## 2020-02-17 LAB — TROPONIN I (HIGH SENSITIVITY): Troponin I (High Sensitivity): 111 ng/L (ref <18)

## 2020-02-17 LAB — FIBRINOGEN: Fibrinogen: 494 mg/dL — ABNORMAL HIGH (ref 210–475)

## 2020-02-17 MED ORDER — GUAIFENESIN-DM 100-10 MG/5ML PO SYRP
10.0000 mL | ORAL_SOLUTION | ORAL | Status: DC | PRN
  Filled 2020-02-17: qty 10

## 2020-02-17 MED ORDER — SODIUM CHLORIDE 0.9 % IV SOLN: 100.0000 mg | Freq: Every day | INTRAVENOUS | Status: DC

## 2020-02-17 MED ORDER — LACTULOSE 10 GM/15ML PO SOLN
10.0000 g | Freq: Once | ORAL | Status: AC
  Administered 2020-02-17: 10 g via ORAL
  Filled 2020-02-17: qty 30

## 2020-02-17 MED ORDER — ADULT MULTIVITAMIN W/MINERALS CH
1.0000 | ORAL_TABLET | Freq: Every day | ORAL | Status: DC
  Administered 2020-02-17 – 2020-02-19 (×3): 1 via ORAL
  Filled 2020-02-17 (×3): qty 1

## 2020-02-17 MED ORDER — IOHEXOL 350 MG/ML SOLN
100.0000 mL | Freq: Once | INTRAVENOUS | Status: AC | PRN
  Administered 2020-02-17: 100 mL via INTRAVENOUS

## 2020-02-17 MED ORDER — TRAZODONE HCL 50 MG PO TABS: 25.0000 mg | ORAL_TABLET | Freq: Every evening | ORAL | Status: DC | PRN

## 2020-02-17 MED ORDER — METHYLPREDNISOLONE SODIUM SUCC 125 MG IJ SOLR
1.0000 mg/kg | Freq: Two times a day (BID) | INTRAMUSCULAR | Status: DC
  Administered 2020-02-17 – 2020-02-19 (×5): 82.5 mg via INTRAVENOUS
  Filled 2020-02-17 (×6): qty 2

## 2020-02-17 MED ORDER — ASCORBIC ACID 500 MG PO TABS: 500.0000 mg | ORAL_TABLET | Freq: Every day | ORAL | Status: DC

## 2020-02-17 MED ORDER — ZINC SULFATE 220 (50 ZN) MG PO CAPS: 220.0000 mg | ORAL_CAPSULE | Freq: Every day | ORAL | Status: DC

## 2020-02-17 MED ORDER — ENOXAPARIN SODIUM 40 MG/0.4ML ~~LOC~~ SOLN
40.0000 mg | SUBCUTANEOUS | Status: DC
  Administered 2020-02-17 – 2020-02-18 (×2): 40 mg via SUBCUTANEOUS
  Filled 2020-02-17 (×2): qty 0.4

## 2020-02-17 MED ORDER — SODIUM CHLORIDE 0.9 % IV SOLN: 500.0000 mg | INTRAVENOUS | Status: DC

## 2020-02-17 MED ORDER — ONDANSETRON HCL 4 MG PO TABS: 8.0000 mg | ORAL_TABLET | Freq: Three times a day (TID) | ORAL | Status: DC | PRN

## 2020-02-17 MED ORDER — FERROUS SULFATE 325 (65 FE) MG PO TABS
325.0000 mg | ORAL_TABLET | Freq: Every morning | ORAL | Status: DC
  Administered 2020-02-17 – 2020-02-19 (×3): 325 mg via ORAL
  Filled 2020-02-17 (×4): qty 1

## 2020-02-17 MED ORDER — POTASSIUM CHLORIDE ER 10 MEQ PO TBCR: 10.0000 meq | EXTENDED_RELEASE_TABLET | Freq: Every day | ORAL | Status: DC

## 2020-02-17 MED ORDER — VITAMIN D 25 MCG (1000 UNIT) PO TABS
1000.0000 [IU] | ORAL_TABLET | Freq: Every day | ORAL | Status: DC
  Administered 2020-02-17 – 2020-02-19 (×3): 1000 [IU] via ORAL
  Filled 2020-02-17 (×3): qty 1

## 2020-02-17 MED ORDER — SODIUM CHLORIDE 0.9 % IV SOLN
200.0000 mg | Freq: Once | INTRAVENOUS | Status: AC
  Administered 2020-02-17: 200 mg via INTRAVENOUS
  Filled 2020-02-17: qty 200

## 2020-02-17 MED ORDER — ONDANSETRON HCL 4 MG PO TABS: 4.0000 mg | ORAL_TABLET | Freq: Four times a day (QID) | ORAL | Status: DC | PRN

## 2020-02-17 MED ORDER — ASCORBIC ACID 500 MG PO TABS
500.0000 mg | ORAL_TABLET | Freq: Every day | ORAL | Status: DC
  Administered 2020-02-17 – 2020-02-19 (×3): 500 mg via ORAL
  Filled 2020-02-17 (×3): qty 1

## 2020-02-17 MED ORDER — SODIUM CHLORIDE 0.9 % IV SOLN
1.0000 g | INTRAVENOUS | Status: DC
  Administered 2020-02-18 – 2020-02-19 (×2): 1 g via INTRAVENOUS
  Filled 2020-02-17 (×2): qty 1
  Filled 2020-02-17: qty 10

## 2020-02-17 MED ORDER — SODIUM CHLORIDE 0.9 % IV SOLN: INTRAVENOUS | Status: DC

## 2020-02-17 MED ORDER — MAGNESIUM HYDROXIDE 400 MG/5ML PO SUSP
30.0000 mL | Freq: Every day | ORAL | Status: DC | PRN
  Filled 2020-02-17: qty 30

## 2020-02-17 MED ORDER — SODIUM CHLORIDE 0.9 % IV SOLN
500.0000 mg | INTRAVENOUS | Status: DC
  Administered 2020-02-18: 18:00:00 500 mg via INTRAVENOUS
  Filled 2020-02-17 (×2): qty 500

## 2020-02-17 MED ORDER — ROSUVASTATIN CALCIUM 10 MG PO TABS
10.0000 mg | ORAL_TABLET | Freq: Every day | ORAL | Status: DC
  Administered 2020-02-17 – 2020-02-18 (×2): 10 mg via ORAL
  Filled 2020-02-17 (×2): qty 1

## 2020-02-17 MED ORDER — BUMETANIDE 1 MG PO TABS
1.0000 mg | ORAL_TABLET | Freq: Every day | ORAL | Status: DC
  Administered 2020-02-17 – 2020-02-19 (×3): 1 mg via ORAL
  Filled 2020-02-17 (×4): qty 1

## 2020-02-17 MED ORDER — ALBUTEROL SULFATE HFA 108 (90 BASE) MCG/ACT IN AERS
2.0000 | INHALATION_SPRAY | Freq: Four times a day (QID) | RESPIRATORY_TRACT | Status: DC
  Administered 2020-02-17 – 2020-02-19 (×9): 2 via RESPIRATORY_TRACT
  Filled 2020-02-17: qty 6.7

## 2020-02-17 MED ORDER — GUAIFENESIN ER 600 MG PO TB12
600.0000 mg | ORAL_TABLET | Freq: Two times a day (BID) | ORAL | Status: DC
  Administered 2020-02-17 – 2020-02-19 (×5): 600 mg via ORAL
  Filled 2020-02-17 (×5): qty 1

## 2020-02-17 MED ORDER — FAMOTIDINE 20 MG PO TABS
20.0000 mg | ORAL_TABLET | Freq: Two times a day (BID) | ORAL | Status: DC
  Administered 2020-02-17 – 2020-02-19 (×5): 20 mg via ORAL
  Filled 2020-02-17 (×5): qty 1

## 2020-02-17 MED ORDER — ZINC SULFATE 220 (50 ZN) MG PO CAPS
220.0000 mg | ORAL_CAPSULE | Freq: Every day | ORAL | Status: DC
  Administered 2020-02-17 – 2020-02-19 (×3): 220 mg via ORAL
  Filled 2020-02-17 (×3): qty 1

## 2020-02-17 MED ORDER — PREDNISONE 50 MG PO TABS: 50.0000 mg | ORAL_TABLET | Freq: Every day | ORAL | Status: DC

## 2020-02-17 MED ORDER — ONDANSETRON HCL 4 MG/2ML IJ SOLN: 4.0000 mg | Freq: Four times a day (QID) | INTRAMUSCULAR | Status: DC | PRN

## 2020-02-17 MED ORDER — MELATONIN 5 MG PO TABS
5.0000 mg | ORAL_TABLET | Freq: Every evening | ORAL | Status: DC | PRN
  Filled 2020-02-17: qty 1

## 2020-02-17 MED ORDER — PANTOPRAZOLE SODIUM 20 MG PO TBEC
20.0000 mg | DELAYED_RELEASE_TABLET | Freq: Every day | ORAL | Status: DC
  Administered 2020-02-17 – 2020-02-19 (×3): 20 mg via ORAL
  Filled 2020-02-17 (×4): qty 1

## 2020-02-17 MED ORDER — HYDROCOD POLST-CPM POLST ER 10-8 MG/5ML PO SUER: 5.0000 mL | Freq: Two times a day (BID) | ORAL | Status: DC | PRN

## 2020-02-17 MED ORDER — ACETAMINOPHEN 325 MG PO TABS: 650.0000 mg | ORAL_TABLET | Freq: Four times a day (QID) | ORAL | Status: DC | PRN

## 2020-02-17 MED ORDER — TRAZODONE HCL 50 MG PO TABS
50.0000 mg | ORAL_TABLET | Freq: Every evening | ORAL | Status: DC | PRN
  Administered 2020-02-18: 50 mg via ORAL
  Filled 2020-02-17: qty 1

## 2020-02-17 NOTE — ED Notes (Signed)
Meal given to pt at this time. °

## 2020-02-17 NOTE — ED Notes (Signed)
Pt sitting on the toilet speaking with family on phone. Pt instructed to pull red cord when assistance needed back to bed. Pt states understanding.

## 2020-02-17 NOTE — ED Notes (Signed)
This RN to bedside at this time. Introduced self to pt. VS stable and NAD noted. Pt denies further needs at this time.

## 2020-02-17 NOTE — ED Notes (Signed)
Pt given ice water. Denies other needs at this time.

## 2020-02-17 NOTE — Progress Notes (Signed)
Remdesivir - Pharmacy Brief Note   O:  CXR: IMPRESSION: 1. No acute cardiopulmonary abnormality. Cardiomegaly and stable lung base atelectasis. 2. Increased gas-filled bowel loops in the visible upper abdomen. Recommend follow-up abdominal radiographs. SpO2: 93 - 98% on 3L [place]   A/P:  Remdesivir 200 mg IVPB once followed by 100 mg IVPB daily x 4 days.   Tobie Lords, PharmD, BCPS Clinical Pharmacist 02/17/2020 1:03 AM

## 2020-02-17 NOTE — Progress Notes (Signed)
PROGRESS NOTE    Sheila Krueger  YYT:[id number] DOB: 1934/10/08 DOA: 02/16/2020 PCP: Kirk Ruths, MD   Brief Narrative: Sheila Krueger  is a 85 y.o. African-American female with a known history of hypertension, CNS lymphoma and recent COVID-19 diagnosed on 02/10/2020 for which she was admitted here and discharged yesterday, presented to the emergency room with acute onset of worsening dyspnea with associated dry cough as well as persistent fever with mild chills.  She admitted to generalized weakness and significantly diminished appetite.  No chest pain or palpitations.  Her pulse oximetry dropped to 89% on room air and was only up to 91% on room air.  She denied any leg pain or edema recent travels.  The patient was fully vaccinated and completed 2 doses in July 2021.  She was treated with IV remdesivir and her hypoxia during hospitalization from 9/4 to 9/8 resolved quickly and she was discharged home on 5 extra days of steroid therapy.  Upon presentation to the emergency room temperature was 100.7 with otherwise normal vital signs.  Labs revealed unremarkable BMP and CBC with high-sensitivity troponin I of 72. 2 view chest x-ray on 9/9 revealed cardiomegaly and stable lung base atelectasis with no acute cardiopulmonary abnormality.  It showed increased gas filled bowel loops in the visible upper abdomen.  The patient was given 10 mg of IV Decadron.  She will be admitted to a medical monitored bed for further evaluation and management.  Assessment & Plan:   Active Problems:   SIRS (systemic inflammatory response syndrome) (HCC)   Hypertension   Cough   Fever  SIRS: Pt met sirs criteria on initial presentation. Suspect Noninfectious cause today, however we will cont to follow.   Fever/Cough/ Covid-19: -pt is s/p remdesivir therapy. -we will cont steroid and nebulizer t/t. -CTA negative for PE and or PNA. -fever could be from pt's worsening atelectasis. -Pt's CRP is higher  than sept 8 and has LDH of 200. -currently pt is stable on oxygen and saturations are about 93%. SpO2: 97 % O2 Flow Rate (L/min): 3 L/min - we will start pt on Rocephin and azithromycin and hold remdesivir.  -cont vit c and zinc.  -we will monitor pt the next 24 hours and if her oxygen requirement goes up we will start pt on baricitinab therapy if pt and family approves. COVID-19 Labs --we will cont CAP therapy.  -- we will also check respiratory panel.    Recent Labs    02/15/20 0556 02/17/20 0226  FERRITIN 55  --   LDH  --  200*  CRP 0.8 2.7*    Lab Results  Component Value Date   SARSCOV2NAA Not Detected 07/11/2019   Noank NEGATIVE 06/28/2019    Essential hypertension. -We will continue her HCTZ.  Dyslipidemia. -We will continue her statin therapy.  GERD. -We will continue PPI therapy.  DVT prophylaxis. -Subcutaneous Lovenox.   Code Status: Full Family Communication: None at bedside Disposition Plan: TBD.  Status is: Inpatient  Not inpatient appropriate, will call UM team and downgrade to OBS.   Dispo: The patient is from: Home              Anticipated d/c is to: Home              Anticipated d/c date is: 3 days              Patient currently is not medically stable to d/c.  Subjective: Pt seen today she is coming back  to [place] after discharge for sob and CTA shows increasing bibasilar atelectasis.  Objective: Vitals:   02/17/20 0745 02/17/20 1011 02/17/20 1030 02/17/20 1231  BP:  127/75 106/86 121/76  Pulse:  84 88 80  Resp:  (!) 23 (!) 25 (!) 21  Temp:      TempSrc:      SpO2: 97% 97%  97%  Weight:      Height:        Intake/Output Summary (Last 24 hours) at 02/17/2020 1619 Last data filed at 02/17/2020 0659 Gross per 24 hour  Intake 676.4 ml  Output --  Net 676.4 ml   Filed Weights   02/16/20 1644  Weight: 82.6 kg    Examination: Blood pressure 121/76, pulse 80, temperature 99.2 F (37.3 C), temperature source Oral, resp.  rate (!) 21, height _0  (1.575 m), weight 82.6 kg, SpO2 97 %.  General exam: Appears calm and comfortable  Respiratory system: Clear to auscultation. Respiratory effort normal. Cardiovascular system: S1 & S2 heard, RRR. No JVD, murmurs, rubs, gallops or clicks. No pedal edema. Gastrointestinal system: Abdomen is nondistended, soft and nontender. No organomegaly or masses felt. Normal bowel sounds heard. Central nervous system: Alert and oriented. No focal neurological deficits. Extremities: Symmetric 5 x 5 power. Skin: No rashes, lesions or ulcers Psychiatry: Judgement and insight appear normal. Mood & affect appropriate.   Data Reviewed: I have personally reviewed following labs and imaging studies  I/O last 3 completed shifts: In: 676.4 [IV Piggyback:676.4] Out: -  No intake/output data recorded. Lab Results  Component Value Date   CREATININE 0.83 02/16/2020   CREATININE 0.83 02/15/2020   CREATININE 0.64 02/14/2020   CBC: Recent Labs  Lab 02/12/20 0428 02/13/20 0414 02/14/20 0609 02/15/20 0556 02/16/20 1652  WBC 2.3* 3.8* 4.5 4.6 5.2  NEUTROABS 1.1* 2.3 2.8 3.8  --   HGB 11.8* 11.1* 11.3* 12.3 12.5  HCT 38.1 34.0* 35.1* 37.3 39.7  MCV 87.0 84.0 84.0 82.7 85.0  PLT 189 200 196 208 474   Basic Metabolic Panel: Recent Labs  Lab 02/12/20 0428 02/13/20 0414 02/14/20 0609 02/15/20 0556 02/16/20 1652  NA 142 141 141 139 137  K 3.5 3.5 3.3* 4.4 4.0  CL 107 104 103 101 99  CO2 _1 GLUCOSE 105* 94 94 136* 112*  BUN _2 CREATININE 0.55 0.67 0.64 0.83 0.83  CALCIUM 8.8* 8.8* 8.5* 8.9 8.8*  MG 2.0 1.9 1.9 2.0  --   PHOS 3.8 2.9 3.3 4.0  --    GFR: Estimated Creatinine Clearance: 52.1 mL/min (by C-G formula based on SCr of 0.83 mg/dL). Liver Function Tests: Recent Labs  Lab 02/10/20 2012 02/12/20 0428 02/13/20 0414 02/14/20 0609 02/15/20 0556  AST _3 ALT _4 ALKPHOS 81 72 60 58 74  BILITOT 0.7 0.6 0.6 0.6 0.5   PROT 7.7 6.8 6.5 6.3* 6.9  ALBUMIN 4.4 3.7 3.6 3.6 3.9   No results for input(s): LIPASE, AMYLASE in the last 168 hours. No results for input(s): AMMONIA in the last 168 hours. Coagulation Profile: No results for input(s): INR, PROTIME in the last 168 hours. Cardiac Enzymes: No results for input(s): CKTOTAL, CKMB, CKMBINDEX, TROPONINI in the last 168 hours. BNP (last 3 results) No results for input(s): PROBNP in the last 8760 hours. HbA1C: No results for input(s): HGBA1C in the last 72 hours. CBG: No results for input(s): GLUCAP in  the last 168 hours. Lipid Profile: No results for input(s): CHOL, HDL, LDLCALC, TRIG, CHOLHDL, LDLDIRECT in the last 72 hours. Thyroid Function Tests: No results for input(s): TSH, T4TOTAL, FREET4, T3FREE, THYROIDAB in the last 72 hours. Anemia Panel: Recent Labs    02/15/20 0556  FERRITIN 55   Sepsis Labs: Recent Labs  Lab 02/11/20 0506 02/17/20 0226  PROCALCITON <0.10 <0.10    No results found for this or any previous visit (from the past 240 hour(s)).    Radiology Studies: DG Chest 2 View  Result Date: 02/16/2020 CLINICAL DATA:  85 year old female positive COVID-19. Worsening symptoms. Low-grade fever. EXAM: CHEST - 2 VIEW COMPARISON:  Chest CTA 02/11/2020 and earlier. FINDINGS: Stable cardiomegaly and elevation of the right hemidiaphragm. Stable trachea. No pneumothorax, pulmonary edema, pleural effusion or consolidation. Streaky right lung base opacity has not significantly changed and atelectasis is favored as per the recent CTA. No acute pulmonary opacity identified. Stable visualized osseous structures. There are increased gas-filled bowel loops in the upper abdomen. No pneumoperitoneum. IMPRESSION: 1. No acute cardiopulmonary abnormality. Cardiomegaly and stable lung base atelectasis. 2. Increased gas-filled bowel loops in the visible upper abdomen. Recommend follow-up abdominal radiographs. Electronically Signed   By: Genevie Ann M.D.   On:  02/16/2020 17:28   CT Angio Chest PE W and/or Wo Contrast  Result Date: 02/17/2020 CLINICAL DATA:  COVID positive, increasing shortness of breath. EXAM: CT ANGIOGRAPHY CHEST WITH CONTRAST TECHNIQUE: Multidetector CT imaging of the chest was performed using the standard protocol during bolus administration of intravenous contrast. Multiplanar CT image reconstructions and MIPs were obtained to evaluate the vascular anatomy. CONTRAST:  126m OMNIPAQUE IOHEXOL 350 MG/ML SOLN COMPARISON:  02/11/2020 FINDINGS: Cardiovascular: No filling defects in the pulmonary arteries to suggest pulmonary emboli. Aorta normal caliber. Aortic atherosclerosis and coronary artery disease. Mild cardiomegaly with small pericardial effusion, similar to prior study. Mediastinum/Nodes: No mediastinal, hilar, or axillary adenopathy. Trachea and esophagus are unremarkable. Thyroid unremarkable. Lungs/Pleura: Dependent atelectasis and bibasilar ground-glass opacities, likely atelectasis, increased since prior study. No effusions. Upper Abdomen: Imaging into the upper abdomen demonstrates no acute findings. Musculoskeletal: Chest wall soft tissues are unremarkable. No acute bony abnormality. Review of the MIP images confirms the above findings. IMPRESSION: Increasing bibasilar and dependent atelectasis. No evidence of pulmonary embolus. Cardiomegaly. Small pericardial effusion, stable since prior study. Coronary artery disease. Aortic Atherosclerosis (ICD10-I70.0). Electronically Signed   By: KRolm BaptiseM.D.   On: 02/17/2020 02:33   CT ABDOMEN PELVIS W CONTRAST  Result Date: 02/17/2020 CLINICAL DATA:  Dilated upper abdominal bowel loops seen on chest x-ray EXAM: CT ABDOMEN AND PELVIS WITH CONTRAST TECHNIQUE: Multidetector CT imaging of the abdomen and pelvis was performed using the standard protocol following bolus administration of intravenous contrast. CONTRAST:  1083mOMNIPAQUE IOHEXOL 350 MG/ML SOLN COMPARISON:  Chest x-ray  02/16/2020.  CT 07/15/2006 FINDINGS: Lower chest: Dependent and bibasilar atelectasis noted. Heart is mildly enlarged. Small pericardial effusion. Hepatobiliary: No focal hepatic abnormality. Gallbladder unremarkable. Pancreas: No focal abnormality or ductal dilatation. Spleen: No focal abnormality.  Normal size. Adrenals/Urinary Tract: No adrenal abnormality. No focal renal abnormality. No stones or hydronephrosis. Urinary bladder is unremarkable. Stomach/Bowel: Mild gaseous distention of the colon may reflect mild ileus. Small bowel and stomach are decompressed. Moderate stool burden in the left:. Marland Kitchenascular/Lymphatic: Aortoiliac atherosclerosis. No evidence of aneurysm or adenopathy. Reproductive: Fibroid uterus.  No adnexal mass. Other: No free fluid or free air. Musculoskeletal: No acute bony abnormality. IMPRESSION: Gaseous distention of the colon may  reflect mild ileus. Moderate stool burden. Enlarged fibroid uterus. Aortic atherosclerosis. Electronically Signed   By: Rolm Baptise M.D.   On: 02/17/2020 02:35        Scheduled Meds: . albuterol  2 puff Inhalation Q6H  . ascorbic acid  500 mg Oral Daily  . bumetanide  1 mg Oral Daily  . cholecalciferol  1,000 Units Oral Daily  . enoxaparin (LOVENOX) injection  40 mg Subcutaneous Q24H  . famotidine  20 mg Oral BID  . ferrous sulfate  325 mg Oral q morning - 10a  . guaiFENesin  600 mg Oral BID  . methylPREDNISolone (SOLU-MEDROL) injection  1 mg/kg Intravenous Q12H   Followed by  . [START ON 02/20/2020] predniSONE  50 mg Oral Daily  . multivitamin with minerals  1 tablet Oral Daily  . pantoprazole  20 mg Oral Daily  . rosuvastatin  10 mg Oral Daily  . zinc sulfate  220 mg Oral Daily   Continuous Infusions: . sodium chloride 75 mL/hr at 02/17/20 0548  . [START ON 02/18/2020] azithromycin (ZITHROMAX) 500 MG IVPB (Vial-Mate [employer])    . [START ON 02/18/2020] cefTRIAXone (ROCEPHIN)  IV       LOS: 0 days    Para Skeans, MD [facility] Pager [phone] If 7PM-7AM, please contact night-coverage [email or web address] Password [facility] 02/17/2020, 4:19 PM

## 2020-02-17 NOTE — H&P (Addendum)
[place]   PATIENT NAME: Sheila Krueger    MR#:  [id number]  DATE OF BIRTH:  01-27-1935  DATE OF ADMISSION:  02/16/2020  PRIMARY CARE PHYSICIAN: Kirk Ruths, MD   REQUESTING/REFERRING PHYSICIAN: Marjean Donna, MD  CHIEF COMPLAINT:   Chief Complaint  Patient presents with  . Fever    HISTORY OF PRESENT ILLNESS:  Sheila Krueger  is a 85 y.o. African-American female with a known history of hypertension, CNS lymphoma and recent COVID-19 diagnosed on 02/10/2020 for which she was admitted here and discharged yesterday, presented to the emergency room with acute onset of worsening dyspnea with associated dry cough as well as persistent fever with mild chills.  She admitted to generalized weakness and significantly diminished appetite.  No chest pain or palpitations.  Her pulse oximetry dropped to 89% on room air and was only up to 91% on room air.  She denied any leg pain or edema recent travels.  The patient was fully vaccinated and completed 2 doses in July 2021.  She was treated with IV remdesivir and her hypoxia during hospitalization from 9/4 to 9/8 resolved quickly and she was discharged home on 5 extra days of steroid therapy.  Upon presentation to the emergency room temperature was 100.7 with otherwise normal vital signs.  Labs revealed unremarkable BMP and CBC with high-sensitivity troponin I of 72. 2 view chest x-ray on 9/9 revealed cardiomegaly and stable lung base atelectasis with no acute cardiopulmonary abnormality.  It showed increased gas filled bowel loops in the visible upper abdomen.  The patient was given 10 mg of IV Decadron.  She will be admitted to a medical monitored bed for further evaluation and management. PAST MEDICAL HISTORY:   Past Medical History:  Diagnosis Date  . 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 Arthritis   . CNS lymphoma ([place])   . Hypertension     PAST SURGICAL HISTORY:  History reviewed. No pertinent surgical history.  She denies any  previous surgeries.  SOCIAL HISTORY:   Social History   Tobacco Use  . Smoking status: Never Smoker  . Smokeless tobacco: Never Used  Substance Use Topics  . Alcohol use: Not Currently    FAMILY HISTORY:   Family History  Problem Relation Age of Onset  . Hypertension Sister     DRUG ALLERGIES:   Allergies  Allergen Reactions  . Aspirin Other (See Comments)    Tachycardia    REVIEW OF SYSTEMS:   ROS As per history of present illness. All pertinent systems were reviewed above. Constitutional, HEENT, cardiovascular, respiratory, GI, GU, musculoskeletal, neuro, psychiatric, endocrine, integumentary and hematologic systems were reviewed and are otherwise negative/unremarkable except for positive findings mentioned above in the HPI.   MEDICATIONS AT HOME:   Prior to Admission medications   Medication Sig Start Date End Date Taking? Authorizing Provider  acetaminophen (TYLENOL 8 HOUR ARTHRITIS PAIN) 650 MG CR tablet Take 650 mg by mouth every 8 (eight) hours as needed for pain.    [provider]  albuterol (VENTOLIN HFA) 108 (90 Base) MCG/ACT inhaler Inhale 2 puffs into the lungs every 6 (six) hours. 02/13/20   Loletha Grayer, MD  ascorbic acid (VITAMIN C) 500 MG tablet Take 1 tablet (500 mg total) by mouth daily. 02/14/20   Wieting, Richard, MD  bumetanide (BUMEX) 1 MG tablet Take 1 mg by mouth daily. 01/27/20   [provider]  [facility] MELATONIN 3 MG TABS tablet Take 3 mg by  mouth at bedtime as needed.  11/29/19   [provider]  dexamethasone (DECADRON) 6 MG tablet Take 1 tablet (6 mg total) by mouth daily for 7 days. 02/14/20 02/21/20  Loletha Grayer, MD  ferrous sulfate 325 (65 FE) MG EC tablet Take 1 tablet by mouth every morning. 01/20/20   [provider]  Multiple Vitamin (MULTIVITAMIN) capsule Take 1 capsule by mouth daily.    [provider]  ondansetron (ZOFRAN) 8 MG tablet Take 8 mg by mouth every 8 (eight) hours as needed for  nausea. 12/28/19   [provider]  pantoprazole (PROTONIX) 20 MG tablet Take 1 tablet (20 mg total) by mouth daily. 07/11/19   Sindy Guadeloupe, MD  potassium chloride (KLOR-CON) 10 MEQ tablet Take 10 mEq by mouth daily. 01/27/20   [provider]  rosuvastatin (CRESTOR) 10 MG tablet Take 1 tablet by mouth daily. 11/30/19   [provider]  traZODone (DESYREL) 50 MG tablet Take 50 mg by mouth at bedtime as needed for sleep. 01/15/20   [provider]  zinc sulfate 220 (50 Zn) MG capsule Take 1 capsule (220 mg total) by mouth daily. 02/14/20   Loletha Grayer, MD      VITAL SIGNS:  Blood pressure (!) 148/75, pulse 97, temperature (!) 100.7 F (38.2 C), temperature source Oral, resp. rate 20, height 5\' 2"  (1.575 m), weight 82.6 kg, SpO2 98 %.  PHYSICAL EXAMINATION:  Physical Exam  GENERAL:  85 y.o.-year-old patient lying in the bed with mild respiratory distress with conversational dyspnea. EYES: Pupils equal, round, reactive to light and accommodation. No scleral icterus. Extraocular muscles intact.  HEENT: Head atraumatic, normocephalic. Oropharynx and nasopharynx clear.  NECK:  Supple, no jugular venous distention. No thyroid enlargement, no tenderness.  LUNGS: Slightly diminished bibasal breath sounds.   CARDIOVASCULAR: Regular rate and rhythm, S1, S2 normal. No murmurs, rubs, or gallops.  ABDOMEN: Soft, nondistended, nontender. Bowel sounds present. No organomegaly or mass.  EXTREMITIES: No pedal edema, cyanosis, or clubbing.  NEUROLOGIC: Cranial nerves II through XII are intact. Muscle strength 5/5 in all extremities. Sensation intact. Gait not checked.  PSYCHIATRIC: The patient is alert and oriented x 3.  Normal affect and good eye contact. SKIN: No obvious rash, lesion, or ulcer.   LABORATORY PANEL:   CBC Recent Labs  Lab 02/16/20 1652  WBC 5.2  HGB 12.5  HCT 39.7  PLT 204    ------------------------------------------------------------------------------------------------------------------  Chemistries  Recent Labs  Lab 02/15/20 0556 02/15/20 0556 02/16/20 1652  NA 139   < > 137  K 4.4   < > 4.0  CL 101   < > 99  CO2 28   < > 28  GLUCOSE 136*   < > 112*  BUN 14   < > 18  CREATININE 0.83   < > 0.83  CALCIUM 8.9   < > 8.8*  MG 2.0  --   --   AST 20  --   --   ALT 17  --   --   ALKPHOS 74  --   --   BILITOT 0.5  --   --    < > = values in this interval not displayed.   ------------------------------------------------------------------------------------------------------------------  Cardiac Enzymes No results for input(s): TROPONINI in the last 168 hours. ------------------------------------------------------------------------------------------------------------------  RADIOLOGY:  DG Chest 2 View  Result Date: 02/16/2020 CLINICAL DATA:  85 year old female positive COVID-19. Worsening symptoms. Low-grade fever. EXAM: CHEST - 2 VIEW COMPARISON:  Chest CTA 02/11/2020  and earlier. FINDINGS: Stable cardiomegaly and elevation of the right hemidiaphragm. Stable trachea. No pneumothorax, pulmonary edema, pleural effusion or consolidation. Streaky right lung base opacity has not significantly changed and atelectasis is favored as per the recent CTA. No acute pulmonary opacity identified. Stable visualized osseous structures. There are increased gas-filled bowel loops in the upper abdomen. No pneumoperitoneum. IMPRESSION: 1. No acute cardiopulmonary abnormality. Cardiomegaly and stable lung base atelectasis. 2. Increased gas-filled bowel loops in the visible upper abdomen. Recommend follow-up abdominal radiographs. Electronically Signed   By: Genevie Ann M.D.   On: 02/16/2020 17:28      IMPRESSION AND PLAN:   1.  Acute hypoxemic respiratory failure secondary to COVID-19. -The patient will be admitted to a medically monitored isolation bed. -O2 protocol will be  followed to keep O2 saturation above 93.   2.  Multifocal pneumonia secondary to COVID-19 with mild sepsis without severe sepsis or septic shock.  This is manifested by fever of 38.2 C and heart rate of 102.  -The patient will be admitted to an isolation monitored bed with droplet and contact precautions. -Given multifocal pneumonia we will empirically place the patient on IV Rocephin and Zithromax for possible bacterial superinfection only with elevated Procalcitonin. -The patient will be placed on scheduled Mucinex and as needed Tussionex. -We will avoid nebulization as much as we can, give bronchodilator MDI if needed, and with deterioration of oxygenation try to avoid BiPAP/CPAP if possible.    -Will obtain sputum Gram stain culture and sensitivity and follow blood cultures. -O2 protocol will be followed. -We will follow CRP, ferritin, LDH and D-dimer. -Will follow manual differential for ANC/ALC ratio as well as follow troponin I and daily CBC with manual differential and CMP. - Will place the patient on IV Remdesivir and IV steroid therapy with Decadron with elevated inflammatory markers.  We will hydrate with IV normal saline. -The patient will be placed on vitamin D3, vitamin C, zinc sulfate, p.o. Pepcid and aspirin. -I discussed baricitinib with the patient and she agreed to proceed with it. -Had a pending chest CTA to rule out PE.  3.  Essential hypertension. -We will continue her HCTZ.  4.  Dyslipidemia. -We will continue her statin therapy.  5.  GERD. -We will continue PPI therapy.  6.  DVT prophylaxis. -Subcutaneous Lovenox.   All the records are reviewed and case discussed with ED provider. The plan of care was discussed in details with the patient (and family). I answered all questions. The patient agreed to proceed with the above mentioned plan. Further management will depend upon hospital course.   CODE STATUS: Full code  Status is: Inpatient  Remains inpatient  appropriate because:Ongoing diagnostic testing needed not appropriate for outpatient work up, Unsafe d/c plan, IV treatments appropriate due to intensity of illness or inability to take PO and Inpatient level of care appropriate due to severity of illness   Dispo: The patient is from: Home              Anticipated d/c is to: Home              Anticipated d/c date is: 3 days              Patient currently is not medically stable to d/c.   TOTAL TIME TAKING CARE OF THIS PATIENT: 55 minutes.    Christel Mormon M.D on 02/17/2020 at 12:42 AM  [facility]   From 7 PM-7 AM, contact night-coverage [email or web address]  CC:  Primary care physician; Kirk Ruths, MD   Note: This dictation was prepared with Dragon dictation along with smaller phrase technology. Any transcriptional typo errors that result from this process are unintentional.

## 2020-02-17 NOTE — ED Notes (Signed)
Pt up to bathroom with this RN to assist.

## 2020-02-17 NOTE — ED Notes (Signed)
Pt helped back into bed and reconnected to monitor.

## 2020-02-18 DIAGNOSIS — J069 Acute upper respiratory infection, unspecified: Secondary | ICD-10-CM

## 2020-02-18 LAB — CBC WITH DIFFERENTIAL/PLATELET
Abs Immature Granulocytes: 0.02 10*3/uL (ref 0.00–0.07)
Basophils Absolute: 0 10*3/uL (ref 0.0–0.1)
Basophils Relative: 0 %
Eosinophils Absolute: 0 10*3/uL (ref 0.0–0.5)
Eosinophils Relative: 0 %
HCT: 41.1 % (ref 36.0–46.0)
Hemoglobin: 13.4 g/dL (ref 12.0–15.0)
Immature Granulocytes: 0 %
Lymphocytes Relative: 8 %
Lymphs Abs: 0.4 10*3/uL — ABNORMAL LOW (ref 0.7–4.0)
MCH: 27.2 pg (ref 26.0–34.0)
MCHC: 32.6 g/dL (ref 30.0–36.0)
MCV: 83.4 fL (ref 80.0–100.0)
Monocytes Absolute: 0.3 10*3/uL (ref 0.1–1.0)
Monocytes Relative: 5 %
Neutro Abs: 4.7 10*3/uL (ref 1.7–7.7)
Neutrophils Relative %: 87 %
Platelets: 210 10*3/uL (ref 150–400)
RBC: 4.93 MIL/uL (ref 3.87–5.11)
RDW: 14.9 % (ref 11.5–15.5)
WBC: 5.5 10*3/uL (ref 4.0–10.5)
nRBC: 0 % (ref 0.0–0.2)

## 2020-02-18 LAB — COMPREHENSIVE METABOLIC PANEL
ALT: 22 U/L (ref 0–44)
AST: 34 U/L (ref 15–41)
Albumin: 3.9 g/dL (ref 3.5–5.0)
Alkaline Phosphatase: 72 U/L (ref 38–126)
Anion gap: 14 (ref 5–15)
BUN: 17 mg/dL (ref 8–23)
CO2: 23 mmol/L (ref 22–32)
Calcium: 8.8 mg/dL — ABNORMAL LOW (ref 8.9–10.3)
Chloride: 101 mmol/L (ref 98–111)
Creatinine, Ser: 0.71 mg/dL (ref 0.44–1.00)
GFR calc Af Amer: >60 mL/min (ref >60)
GFR calc non Af Amer: >60 mL/min (ref >60)
Glucose, Bld: 180 mg/dL — ABNORMAL HIGH (ref 70–99)
Potassium: 3.7 mmol/L (ref 3.5–5.1)
Sodium: 138 mmol/L (ref 135–145)
Total Bilirubin: 0.6 mg/dL (ref 0.3–1.2)
Total Protein: 7.3 g/dL (ref 6.5–8.1)

## 2020-02-18 LAB — C-REACTIVE PROTEIN: CRP: 2.7 mg/dL — ABNORMAL HIGH (ref <1.0)

## 2020-02-18 LAB — TROPONIN I (HIGH SENSITIVITY): Troponin I (High Sensitivity): 104 ng/L (ref <18)

## 2020-02-18 LAB — FERRITIN: Ferritin: 89 ng/mL (ref 11–307)

## 2020-02-18 LAB — FIBRIN DERIVATIVES D-DIMER (ARMC ONLY): Fibrin derivatives D-dimer (ARMC): 1106.58 ng/mL (FEU) — ABNORMAL HIGH (ref 0.00–499.00)

## 2020-02-18 NOTE — Progress Notes (Signed)
PROGRESS NOTE    Karliah Kowalchuk  GBK:[id number] DOB: 11/15/1934 DOA: 02/16/2020 PCP: Kirk Ruths, MD   Brief Narrative: Modesty Rudy  is a 85 y.o. African-American female with a known history of hypertension, CNS lymphoma and recent COVID-19 diagnosed on 02/10/2020 for which she was admitted here and discharged yesterday, presented to the emergency room with acute onset of worsening dyspnea with associated dry cough as well as persistent fever with mild chills.  She admitted to generalized weakness and significantly diminished appetite.  No chest pain or palpitations.  Her pulse oximetry dropped to 89% on room air and was only up to 91% on room air.  She denied any leg pain or edema recent travels.  The patient was fully vaccinated and completed 2 doses in July 2021.  She was treated with IV remdesivir and her hypoxia during hospitalization from 9/4 to 9/8 resolved quickly and she was discharged home on 5 extra days of steroid therapy.  Upon presentation to the emergency room temperature was 100.7 with otherwise normal vital signs.  Labs revealed unremarkable BMP and CBC with high-sensitivity troponin I of 72. 2 view chest x-ray on 9/9 revealed cardiomegaly and stable lung base atelectasis with no acute cardiopulmonary abnormality.  It showed increased gas filled bowel loops in the visible upper abdomen.  The patient was given 10 mg of IV Decadron.  She will be admitted to a medical monitored bed for further evaluation and management.  Assessment & Plan:   Active Problems:   SIRS (systemic inflammatory response syndrome) (HCC)   Hypertension   Cough   Fever  SIRS: Pt met sirs criteria on initial presentation. Suspect Noninfectious cause today, however we will cont to follow.   Fever/Cough/ Covid-19: -pt is s/p remdesivir therapy. -we will cont steroid and nebulizer t/t. -CTA negative for PE and or PNA. -fever could be from pt's worsening atelectasis. -Pt's CRP is higher  than sept 8 and has LDH of 200. -currently pt is stable on oxygen and saturations are about 93%. SpO2: 97 % O2 Flow Rate (L/min): 3 L/min - we will start pt on Rocephin and azithromycin and hold remdesivir.  -cont vit c and zinc.  -we will monitor pt the next 24 hours and if her oxygen requirement goes up we will start pt on baricitinab therapy if pt and family approves. COVID-19 Labs --we will cont CAP therapy.  -- we will also check respiratory panel.    Recent Labs    02/17/20 0226 02/18/20 0602  FERRITIN  --  89  LDH 200*  --   CRP 2.7* 2.7*    Lab Results  Component Value Date   SARSCOV2NAA Not Detected 07/11/2019   [place] NEGATIVE 06/28/2019   --Pt is stable and has improved significantly and we will observe overnight and discharge. I suspect this is a bronchitis picture post covid-19.  Essential hypertension. -We will continue her HCTZ.  Dyslipidemia. -We will continue her statin therapy.  GERD. -We will continue PPI therapy.  DVT prophylaxis. -Subcutaneous Lovenox.   Code Status: Full Family Communication: None at bedside Disposition Plan: TBD.  Status is: Inpatient  Not inpatient appropriate, will call UM team and downgrade to OBS.   Dispo: The patient is from: Home              Anticipated d/c is to: Home              Anticipated d/c date is:1 day  Patient currently is not medically stable to d/c.  Subjective: Pt seen today she is coming back to [place] after discharge for sob and CTA shows increasing bibasilar atelectasis.  Pt is sitting upright talking on phone not sob but is having cough.   Objective: Vitals:   02/17/20 2226 02/18/20 0525 02/18/20 0743 02/18/20 1158  BP: (!) 147/85 (!) 119/55 109/77 (!) 123/97  Pulse: 92 83 86 86  Resp:  16 18 20   Temp: 97.7 F (36.5 C) 98.2 F (36.8 C) 98.8 F (37.1 C) 98 F (36.7 C)  TempSrc: Oral Oral Oral Oral  SpO2: 98% 92% 91% 97%  Weight: 79.1 kg     Height: 5' 2"  (1.575 m)        Intake/Output Summary (Last 24 hours) at 02/18/2020 1532 Last data filed at 02/17/2020 2057 Gross per 24 hour  Intake 1000 ml  Output --  Net 1000 ml   Filed Weights   02/16/20 1644 02/17/20 2226  Weight: 82.6 kg 79.1 kg    Examination: Blood pressure (!) 123/97, pulse 86, temperature 98 F (36.7 C), temperature source Oral, resp. rate 20, height 5' 2"  (1.575 m), weight 79.1 kg, SpO2 97 %.  General exam: Appears calm and comfortable  Respiratory system: Clear to auscultation. Respiratory effort normal. Cardiovascular system: S1 & S2 heard, RRR. No JVD, murmurs, rubs, gallops or clicks. No pedal edema. Gastrointestinal system: Abdomen is nondistended, soft and nontender. No organomegaly or masses felt. Normal bowel sounds heard. Central nervous system: Alert and oriented. No focal neurological deficits. Extremities: Symmetric 5 x 5 power. Skin: No rashes, lesions or ulcers Psychiatry: Judgement and insight appear normal. Mood & affect appropriate.   Data Reviewed: I have personally reviewed following labs and imaging studies  I/O last 3 completed shifts: In: 1676.4 [I.V.:1000; IV Piggyback:676.4] Out: -  No intake/output data recorded. Lab Results  Component Value Date   CREATININE 0.71 02/18/2020   CREATININE 0.69 02/17/2020   CREATININE 0.83 02/16/2020   CBC: Recent Labs  Lab 02/12/20 0428 02/12/20 0428 02/13/20 0414 02/14/20 0609 02/15/20 0556 02/16/20 1652 02/18/20 0602  WBC 2.3*   < > 3.8* 4.5 4.6 5.2 5.5  NEUTROABS 1.1*  --  2.3 2.8 3.8  --  4.7  HGB 11.8*   < > 11.1* 11.3* 12.3 12.5 13.4  HCT 38.1   < > 34.0* 35.1* 37.3 39.7 41.1  MCV 87.0   < > 84.0 84.0 82.7 85.0 83.4  PLT 189   < > 200 196 208 204 210   < > = values in this interval not displayed.   Basic Metabolic Panel: Recent Labs  Lab 02/12/20 0428 02/12/20 0428 02/13/20 0414 02/13/20 0414 02/14/20 0609 02/15/20 0556 02/16/20 1652 02/17/20 1628 02/18/20 0602  NA 142   < > 141   < >  141 139 137 136 138  K 3.5   < > 3.5   < > 3.3* 4.4 4.0 3.8 3.7  CL 107   < > 104   < > 103 101 99 99 101  CO2 26   < > 27   < > 28 28 28 25 23   GLUCOSE 105*   < > 94   < > 94 136* 112* 191* 180*  BUN 11   < > 14   < > 13 14 18 18 17   CREATININE 0.55   < > 0.67   < > 0.64 0.83 0.83 0.69 0.71  CALCIUM 8.8*   < > 8.8*   < >  8.5* 8.9 8.8* 8.2* 8.8*  MG 2.0  --  1.9  --  1.9 2.0  --   --   --   PHOS 3.8  --  2.9  --  3.3 4.0  --   --   --    < > = values in this interval not displayed.   GFR: Estimated Creatinine Clearance: 52.8 mL/min (by C-G formula based on SCr of 0.71 mg/dL). Liver Function Tests: Recent Labs  Lab 02/13/20 0414 02/14/20 0609 02/15/20 0556 02/17/20 1628 02/18/20 0602  AST 20 20 20 29  34  ALT 14 15 17 16 22   ALKPHOS 60 58 74 71 72  BILITOT 0.6 0.6 0.5 0.7 0.6  PROT 6.5 6.3* 6.9 7.0 7.3  ALBUMIN 3.6 3.6 3.9 3.7 3.9   Anemia Panel: Recent Labs    02/18/20 0602  FERRITIN 89   Sepsis Labs: Recent Labs  Lab 02/17/20 0226  PROCALCITON <0.10    No results found for this or any previous visit (from the past 240 hour(s)).    Radiology Studies: DG Chest 2 View  Result Date: 02/16/2020 CLINICAL DATA:  85 year old female positive COVID-19. Worsening symptoms. Low-grade fever. EXAM: CHEST - 2 VIEW COMPARISON:  Chest CTA 02/11/2020 and earlier. FINDINGS: Stable cardiomegaly and elevation of the right hemidiaphragm. Stable trachea. No pneumothorax, pulmonary edema, pleural effusion or consolidation. Streaky right lung base opacity has not significantly changed and atelectasis is favored as per the recent CTA. No acute pulmonary opacity identified. Stable visualized osseous structures. There are increased gas-filled bowel loops in the upper abdomen. No pneumoperitoneum. IMPRESSION: 1. No acute cardiopulmonary abnormality. Cardiomegaly and stable lung base atelectasis. 2. Increased gas-filled bowel loops in the visible upper abdomen. Recommend follow-up abdominal  radiographs. Electronically Signed   By: Genevie Ann M.D.   On: 02/16/2020 17:28   CT Angio Chest PE W and/or Wo Contrast  Result Date: 02/17/2020 CLINICAL DATA:  COVID positive, increasing shortness of breath. EXAM: CT ANGIOGRAPHY CHEST WITH CONTRAST TECHNIQUE: Multidetector CT imaging of the chest was performed using the standard protocol during bolus administration of intravenous contrast. Multiplanar CT image reconstructions and MIPs were obtained to evaluate the vascular anatomy. CONTRAST:  110m OMNIPAQUE IOHEXOL 350 MG/ML SOLN COMPARISON:  02/11/2020 FINDINGS: Cardiovascular: No filling defects in the pulmonary arteries to suggest pulmonary emboli. Aorta normal caliber. Aortic atherosclerosis and coronary artery disease. Mild cardiomegaly with small pericardial effusion, similar to prior study. Mediastinum/Nodes: No mediastinal, hilar, or axillary adenopathy. Trachea and esophagus are unremarkable. Thyroid unremarkable. Lungs/Pleura: Dependent atelectasis and bibasilar ground-glass opacities, likely atelectasis, increased since prior study. No effusions. Upper Abdomen: Imaging into the upper abdomen demonstrates no acute findings. Musculoskeletal: Chest wall soft tissues are unremarkable. No acute bony abnormality. Review of the MIP images confirms the above findings. IMPRESSION: Increasing bibasilar and dependent atelectasis. No evidence of pulmonary embolus. Cardiomegaly. Small pericardial effusion, stable since prior study. Coronary artery disease. Aortic Atherosclerosis (ICD10-I70.0). Electronically Signed   By: KRolm BaptiseM.D.   On: 02/17/2020 02:33   CT ABDOMEN PELVIS W CONTRAST  Result Date: 02/17/2020 CLINICAL DATA:  Dilated upper abdominal bowel loops seen on chest x-ray EXAM: CT ABDOMEN AND PELVIS WITH CONTRAST TECHNIQUE: Multidetector CT imaging of the abdomen and pelvis was performed using the standard protocol following bolus administration of intravenous contrast. CONTRAST:  1034m OMNIPAQUE IOHEXOL 350 MG/ML SOLN COMPARISON:  Chest x-ray 02/16/2020.  CT 07/15/2006 FINDINGS: Lower chest: Dependent and bibasilar atelectasis noted. Heart is mildly enlarged. Small pericardial effusion. Hepatobiliary:  No focal hepatic abnormality. Gallbladder unremarkable. Pancreas: No focal abnormality or ductal dilatation. Spleen: No focal abnormality.  Normal size. Adrenals/Urinary Tract: No adrenal abnormality. No focal renal abnormality. No stones or hydronephrosis. Urinary bladder is unremarkable. Stomach/Bowel: Mild gaseous distention of the colon may reflect mild ileus. Small bowel and stomach are decompressed. Moderate stool burden in the left:Marland Kitchen Vascular/Lymphatic: Aortoiliac atherosclerosis. No evidence of aneurysm or adenopathy. Reproductive: Fibroid uterus.  No adnexal mass. Other: No free fluid or free air. Musculoskeletal: No acute bony abnormality. IMPRESSION: Gaseous distention of the colon may reflect mild ileus. Moderate stool burden. Enlarged fibroid uterus. Aortic atherosclerosis. Electronically Signed   By: Rolm Baptise M.D.   On: 02/17/2020 02:35        Scheduled Meds: . albuterol  2 puff Inhalation Q6H  . ascorbic acid  500 mg Oral Daily  . bumetanide  1 mg Oral Daily  . cholecalciferol  1,000 Units Oral Daily  . enoxaparin (LOVENOX) injection  40 mg Subcutaneous Q24H  . famotidine  20 mg Oral BID  . ferrous sulfate  325 mg Oral q morning - 10a  . guaiFENesin  600 mg Oral BID  . methylPREDNISolone (SOLU-MEDROL) injection  1 mg/kg Intravenous Q12H   Followed by  . [START ON 02/20/2020] predniSONE  50 mg Oral Daily  . multivitamin with minerals  1 tablet Oral Daily  . pantoprazole  20 mg Oral Daily  . rosuvastatin  10 mg Oral Daily  . zinc sulfate  220 mg Oral Daily   Continuous Infusions: . sodium chloride Stopped (02/17/20 2057)  . azithromycin (ZITHROMAX) 500 MG IVPB (Vial-Mate [employer])    . cefTRIAXone (ROCEPHIN)  IV 1 g (02/18/20 1023)     LOS: 1 day     Para Skeans, MD [facility] Pager [phone] If 7PM-7AM, please contact night-coverage [email or web address] Password [facility] 02/18/2020, 3:32 PM

## 2020-02-18 NOTE — Progress Notes (Signed)
Completed 6 min walk with [facility], 02 sat ranged from 91% - 96%. Physician made aware.

## 2020-02-18 NOTE — Plan of Care (Signed)
  Problem: Education: Goal: Knowledge of risk factors and measures for prevention of condition will improve Outcome: Progressing   Problem: Coping: Goal: Psychosocial and spiritual needs will be supported Outcome: Progressing   Problem: Respiratory: Goal: Will maintain a patent airway Outcome: Progressing Goal: Complications related to the disease process, condition or treatment will be avoided or minimized Outcome: Progressing   

## 2020-02-18 NOTE — Progress Notes (Signed)
Pt was tearful because be nephew that lives in [place] passed away and she is tired of being sick. Offered some emotional supports and encouraged her to keep her spirits lifted through supportive  friends and family. Pt expressed her gratefulness.

## 2020-02-19 LAB — CBC WITH DIFFERENTIAL/PLATELET
Abs Immature Granulocytes: 0.05 10*3/uL (ref 0.00–0.07)
Basophils Absolute: 0 10*3/uL (ref 0.0–0.1)
Basophils Relative: 0 %
Eosinophils Absolute: 0 10*3/uL (ref 0.0–0.5)
Eosinophils Relative: 0 %
HCT: 37.9 % (ref 36.0–46.0)
Hemoglobin: 12.4 g/dL (ref 12.0–15.0)
Immature Granulocytes: 1 %
Lymphocytes Relative: 4 %
Lymphs Abs: 0.3 10*3/uL — ABNORMAL LOW (ref 0.7–4.0)
MCH: 27.2 pg (ref 26.0–34.0)
MCHC: 32.7 g/dL (ref 30.0–36.0)
MCV: 83.1 fL (ref 80.0–100.0)
Monocytes Absolute: 0.4 10*3/uL (ref 0.1–1.0)
Monocytes Relative: 5 %
Neutro Abs: 7.3 10*3/uL (ref 1.7–7.7)
Neutrophils Relative %: 90 %
Platelets: 193 10*3/uL (ref 150–400)
RBC: 4.56 MIL/uL (ref 3.87–5.11)
RDW: 15 % (ref 11.5–15.5)
WBC: 8.1 10*3/uL (ref 4.0–10.5)
nRBC: 0 % (ref 0.0–0.2)

## 2020-02-19 LAB — COMPREHENSIVE METABOLIC PANEL
ALT: 18 U/L (ref 0–44)
AST: 28 U/L (ref 15–41)
Albumin: 3.4 g/dL — ABNORMAL LOW (ref 3.5–5.0)
Alkaline Phosphatase: 60 U/L (ref 38–126)
Anion gap: 10 (ref 5–15)
BUN: 17 mg/dL (ref 8–23)
CO2: 27 mmol/L (ref 22–32)
Calcium: 8.5 mg/dL — ABNORMAL LOW (ref 8.9–10.3)
Chloride: 101 mmol/L (ref 98–111)
Creatinine, Ser: 0.66 mg/dL (ref 0.44–1.00)
GFR calc Af Amer: >60 mL/min (ref >60)
GFR calc non Af Amer: >60 mL/min (ref >60)
Glucose, Bld: 180 mg/dL — ABNORMAL HIGH (ref 70–99)
Potassium: 3.5 mmol/L (ref 3.5–5.1)
Sodium: 138 mmol/L (ref 135–145)
Total Bilirubin: 0.5 mg/dL (ref 0.3–1.2)
Total Protein: 6.6 g/dL (ref 6.5–8.1)

## 2020-02-19 LAB — FIBRIN DERIVATIVES D-DIMER (ARMC ONLY): Fibrin derivatives D-dimer (ARMC): 775.04 ng/mL (FEU) — ABNORMAL HIGH (ref 0.00–499.00)

## 2020-02-19 LAB — C-REACTIVE PROTEIN: CRP: 0.8 mg/dL (ref <1.0)

## 2020-02-19 LAB — FERRITIN: Ferritin: 89 ng/mL (ref 11–307)

## 2020-02-19 NOTE — Discharge Summary (Signed)
Physician Discharge Summary  Sheila Krueger PPI:[id number] DOB: 15-Sep-1934 DOA: 02/16/2020  PCP: Kirk Ruths, MD  Admit date: 02/16/2020 Discharge date: 02/19/2020   Discharge Diagnoses:  Active Problems:   SIRS (systemic inflammatory response syndrome) (HCC)   Hypertension   Cough   Fever   Discharge Condition:  Stable  Diet recommendation:  Cardiac diet.    Filed Weights   02/16/20 1644 02/17/20 2226  Weight: 82.6 kg 79.1 kg    Discharge activity: As tolerated.   History of present illness:  [name], CNS lymphoma and recent COVID-19 diagnosed on 02/10/2020 for which she was admitted here and discharged yesterday, presented to the emergency room with acute onset of worsening dyspnea with associated dry cough as well as persistent fever with mild chills. She admitted to generalized weakness and significantly diminished appetite. No chest pain or palpitations. Her pulse oximetry dropped to 89% on room air and was only up to 91% on room air. She denied any leg pain or edema recent travels. The patient was fully vaccinated and completed 2 doses in July 2021. She was treated with IV remdesivir and her hypoxia during hospitalization from 9/4 to9/8 resolved quickly and she was discharged home on 5 extra days of steroid therapy.  Upon presentation to the emergency room temperature was 100.7 with otherwise normal vital signs. Labs revealed unremarkable BMP and CBC with high-sensitivity troponin I of 72. 2 view chest x-ray on 9/9 revealed cardiomegaly and stable lung base atelectasis with no acute cardiopulmonary abnormality. It showed increased gas filled bowel loops in the visible upper abdomen.  The patient was given 10 mg of IV Decadron. She will be admitted to a medical monitored bed for further evaluation and management.  Hospital Course:  Pt was admitted as a bounceback from her  discharge prior for what seems like a bronchial hyperreactivity or asthma or bronchitits like symptoms. she was stable and her stay was stable. On initial presentation pt had a low grade fever which is suspect could be form her worsening atelectasis on imaging. Pt was continued on steroid therapy and has not required oxygen for past 48 hours. Pt feels much better. Ambulating pulse oximetry is table and pt is to be d/c home today with cont decadron and inhalers as needed.   Procedures: None  Consultations: None  Discharge Exam: Vitals:   02/19/20 0824 02/19/20 1158  BP: (!) 144/80 122/73  Pulse: 90 87  Resp: 19 18  Temp: 98.5 F (36.9 C) 98.4 F (36.9 C)  SpO2: 95% 95%    Physical Exam Vitals and nursing note reviewed.  Constitutional:      Appearance: Normal appearance.  HENT:     Head: Normocephalic and atraumatic.     Right Ear: External ear normal.     Left Ear: External ear normal.     Nose: Nose normal.     Mouth/Throat:     Pharynx: Oropharynx is clear.  Eyes:     Extraocular Movements: Extraocular movements intact.  Cardiovascular:     Rate and Rhythm: Normal rate and regular rhythm.     Pulses: Normal pulses.     Heart sounds: Normal heart sounds.  Pulmonary:     Effort: Pulmonary effort is normal.     Breath sounds: Normal breath sounds.  Abdominal:     General: Bowel sounds are normal.     Palpations: Abdomen is soft.  Skin:    General: Skin is warm.  Neurological:     General: No focal deficit present.     Mental Status: She is alert and oriented to person, place, and time. Mental status is at baseline.  Psychiatric:        Mood and Affect: Mood normal.     Discharge Instructions  Discharge Instructions    Call MD for:  difficulty breathing, headache or visual disturbances   Complete by: As directed    Call MD for:  extreme fatigue   Complete by: As directed    Call MD for:  hives   Complete by: As directed    Call MD for:  persistant dizziness  or light-headedness   Complete by: As directed    Call MD for:  persistant nausea and vomiting   Complete by: As directed    Call MD for:  severe uncontrolled pain   Complete by: As directed    Call MD for:  temperature >100.4   Complete by: As directed    Diet - low sodium heart healthy   Complete by: As directed    Discharge instructions   Complete by: As directed    Pt advised to f/u with pcp for hospital followup.   Increase activity slowly   Complete by: As directed      Allergies as of 02/19/2020      Reactions   Aspirin Other (See Comments)   Tachycardia      Medication List    STOP taking these medications   ondansetron 8 MG tablet Commonly known as: ZOFRAN     TAKE these medications   albuterol 108 (90 Base) MCG/ACT inhaler Commonly known as: VENTOLIN HFA Inhale 2 puffs into the lungs every 6 (six) hours.   ascorbic acid 500 MG tablet Commonly known as: VITAMIN C Take 1 tablet (500 mg total) by mouth daily.   bumetanide 1 MG tablet Commonly known as: BUMEX Take 1 mg by mouth daily.   [employer] Melatonin 3 MG Tabs tablet Generic drug: melatonin Take 3 mg by mouth at bedtime as needed.   dexamethasone 6 MG tablet Commonly known as: DECADRON Take 1 tablet (6 mg total) by mouth daily for 7 days.   ferrous sulfate 325 (65 FE) MG EC tablet Take 1 tablet by mouth every morning.   multivitamin capsule Take 1 capsule by mouth daily.   pantoprazole 20 MG tablet Commonly known as: Protonix Take 1 tablet (20 mg total) by mouth daily.   potassium chloride 10 MEQ tablet Commonly known as: KLOR-CON Take 10 mEq by mouth daily.   rosuvastatin 10 MG tablet Commonly known as: CRESTOR Take 1 tablet by mouth daily.   traZODone 50 MG tablet Commonly known as: DESYREL Take 50 mg by mouth at bedtime as needed for sleep.   Tylenol 8 Hour Arthritis Pain 650 MG CR tablet Generic drug: acetaminophen Take 650 mg by mouth every 8 (eight) hours as needed for pain.    zinc sulfate 220 (50 Zn) MG capsule Take 1 capsule (220 mg total) by mouth daily.      Allergies  Allergen Reactions  . Aspirin Other (See Comments)    Tachycardia    Follow-up Information    Kirk Ruths, MD Follow up in 1 week(s).   Specialty: Internal Medicine Contact information: [address] [place] [id number] [phone]              The results of significant diagnostics from this hospitalization (including imaging, microbiology,  ancillary and laboratory) are listed below for reference.    Significant Diagnostic Studies: DG Chest 2 View  Result Date: 02/16/2020 CLINICAL DATA:  85 year old female positive COVID-19. Worsening symptoms. Low-grade fever. EXAM: CHEST - 2 VIEW COMPARISON:  Chest CTA 02/11/2020 and earlier. FINDINGS: Stable cardiomegaly and elevation of the right hemidiaphragm. Stable trachea. No pneumothorax, pulmonary edema, pleural effusion or consolidation. Streaky right lung base opacity has not significantly changed and atelectasis is favored as per the recent CTA. No acute pulmonary opacity identified. Stable visualized osseous structures. There are increased gas-filled bowel loops in the upper abdomen. No pneumoperitoneum. IMPRESSION: 1. No acute cardiopulmonary abnormality. Cardiomegaly and stable lung base atelectasis. 2. Increased gas-filled bowel loops in the visible upper abdomen. Recommend follow-up abdominal radiographs. Electronically Signed   By: Genevie Ann M.D.   On: 02/16/2020 17:28   CT Angio Chest PE W and/or Wo Contrast  Result Date: 02/17/2020 CLINICAL DATA:  COVID positive, increasing shortness of breath. EXAM: CT ANGIOGRAPHY CHEST WITH CONTRAST TECHNIQUE: Multidetector CT imaging of the chest was performed using the standard protocol during bolus administration of intravenous contrast. Multiplanar CT image reconstructions and MIPs were obtained to evaluate the vascular anatomy. CONTRAST:  155mL  OMNIPAQUE IOHEXOL 350 MG/ML SOLN COMPARISON:  02/11/2020 FINDINGS: Cardiovascular: No filling defects in the pulmonary arteries to suggest pulmonary emboli. Aorta normal caliber. Aortic atherosclerosis and coronary artery disease. Mild cardiomegaly with small pericardial effusion, similar to prior study. Mediastinum/Nodes: No mediastinal, hilar, or axillary adenopathy. Trachea and esophagus are unremarkable. Thyroid unremarkable. Lungs/Pleura: Dependent atelectasis and bibasilar ground-glass opacities, likely atelectasis, increased since prior study. No effusions. Upper Abdomen: Imaging into the upper abdomen demonstrates no acute findings. Musculoskeletal: Chest wall soft tissues are unremarkable. No acute bony abnormality. Review of the MIP images confirms the above findings. IMPRESSION: Increasing bibasilar and dependent atelectasis. No evidence of pulmonary embolus. Cardiomegaly. Small pericardial effusion, stable since prior study. Coronary artery disease. Aortic Atherosclerosis (ICD10-I70.0). Electronically Signed   By: Rolm Baptise M.D.   On: 02/17/2020 02:33   CT Angio Chest PE W/Cm &/Or Wo Cm  Result Date: 02/11/2020 CLINICAL DATA:  Shortness of breath, COVID, chest pain, cough EXAM: CT ANGIOGRAPHY CHEST WITH CONTRAST TECHNIQUE: Multidetector CT imaging of the chest was performed using the standard protocol during bolus administration of intravenous contrast. Multiplanar CT image reconstructions and MIPs were obtained to evaluate the vascular anatomy. CONTRAST:  64mL OMNIPAQUE IOHEXOL 350 MG/ML SOLN COMPARISON:  Chest radiograph dated 02/11/2020 FINDINGS: Cardiovascular: Satisfactory opacification the bilateral pulmonary arteries to the segmental level. No evidence of pulmonary embolism. Study is not tailored for evaluation of the thoracic aorta,. No evidence of thoracic aortic aneurysm. Atherosclerotic calcifications of the aortic arch. Heart is top-normal in size.  No pericardial effusion. Coronary  atherosclerosis of the LAD. Mediastinum/Nodes: No suspicious mediastinal lymphadenopathy. Lungs/Pleura: Evaluation of the lung parenchyma is constrained by respiratory motion. Within that constraint, there are no suspicious pulmonary nodules. Minimal dependent opacities in the posterior upper lobes (series 6/image 26), possibly atelectasis or mild infection. No focal consolidation. Trace left pleural effusion. No pneumothorax. Upper Abdomen: Visualized upper abdomen is grossly unremarkable. Musculoskeletal: Degenerative changes of the visualized thoracolumbar spine. Review of the MIP images confirms the above findings. IMPRESSION: No evidence of pulmonary embolism. Minimal dependent atelectasis in the lungs bilaterally, less likely mild infection. Trace left pleural effusion. Aortic Atherosclerosis (ICD10-I70.0). Electronically Signed   By: Julian Hy M.D.   On: 02/11/2020 06:42   CT ABDOMEN PELVIS W CONTRAST  Result Date:  02/17/2020 CLINICAL DATA:  Dilated upper abdominal bowel loops seen on chest x-ray EXAM: CT ABDOMEN AND PELVIS WITH CONTRAST TECHNIQUE: Multidetector CT imaging of the abdomen and pelvis was performed using the standard protocol following bolus administration of intravenous contrast. CONTRAST:  1102mL OMNIPAQUE IOHEXOL 350 MG/ML SOLN COMPARISON:  Chest x-ray 02/16/2020.  CT 07/15/2006 FINDINGS: Lower chest: Dependent and bibasilar atelectasis noted. Heart is mildly enlarged. Small pericardial effusion. Hepatobiliary: No focal hepatic abnormality. Gallbladder unremarkable. Pancreas: No focal abnormality or ductal dilatation. Spleen: No focal abnormality.  Normal size. Adrenals/Urinary Tract: No adrenal abnormality. No focal renal abnormality. No stones or hydronephrosis. Urinary bladder is unremarkable. Stomach/Bowel: Mild gaseous distention of the colon may reflect mild ileus. Small bowel and stomach are decompressed. Moderate stool burden in the left:Marland Kitchen Vascular/Lymphatic: Aortoiliac  atherosclerosis. No evidence of aneurysm or adenopathy. Reproductive: Fibroid uterus.  No adnexal mass. Other: No free fluid or free air. Musculoskeletal: No acute bony abnormality. IMPRESSION: Gaseous distention of the colon may reflect mild ileus. Moderate stool burden. Enlarged fibroid uterus. Aortic atherosclerosis. Electronically Signed   By: Rolm Baptise M.D.   On: 02/17/2020 02:35   US Venous Img Lower Bilateral (DVT)  Result Date: 02/14/2020 CLINICAL DATA:  Shortness of breath. EXAM: BILATERAL LOWER EXTREMITY VENOUS DOPPLER ULTRASOUND TECHNIQUE: Gray-scale sonography with compression, as well as color and duplex ultrasound, were performed to evaluate the deep venous system(s) from the level of the common femoral vein through the popliteal and proximal calf veins. COMPARISON:  None. FINDINGS: VENOUS Normal compressibility of the common femoral, superficial femoral, and popliteal veins, as well as the visualized calf veins. Visualized portions of profunda femoral vein and great saphenous vein unremarkable. No filling defects to suggest DVT on grayscale or color Doppler imaging. Doppler waveforms show normal direction of venous flow, normal respiratory plasticity and response to augmentation. OTHER In the right inguinal region, there is a 3.5 x 1.6 x 2.6 cm complex, septated collection within the subcutaneous soft tissues. There does not appear to be significant internal color Doppler flow within this mass. Limitations: none IMPRESSION: 1. No DVT. 2. Complex collection measuring approximately 3.5 cm in the right inguinal region. Differential considerations include a hematoma/seroma, thrombosed pseudoaneurysm, versus a necrotic lymph node. Correlation with physical exam and history is recommended. Electronically Signed   By: Constance Holster M.D.   On: 02/14/2020 17:22   DG Chest Port 1 View  Result Date: 02/14/2020 CLINICAL DATA:  History of COVID pneumonia EXAM: PORTABLE CHEST 1 VIEW COMPARISON:   02/11/2020, 10/10/2019 CT 02/11/2020 FINDINGS: Chronic elevation right diaphragm. No focal consolidation or effusion. Enlarged cardiomediastinal silhouette with aortic atherosclerosis. No pneumothorax IMPRESSION: No active cardiopulmonary disease. Cardiomegaly. Electronically Signed   By: Donavan Foil M.D.   On: 02/14/2020 17:21   DG Chest Port 1 View  Result Date: 02/11/2020 CLINICAL DATA:  COVID-19 EXAM: PORTABLE CHEST 1 VIEW COMPARISON:  10/10/2019 FINDINGS: Hazy bilateral opacities. Cardiomediastinal contours are normal. No pleural effusion. Right basilar atelectasis. IMPRESSION: Hazy bilateral opacities, likely multifocal infection. Electronically Signed   By: Ulyses Jarred M.D.   On: 02/11/2020 04:29   ECHOCARDIOGRAM COMPLETE  Result Date: 02/13/2020    ECHOCARDIOGRAM REPORT   Patient Name:   Sheila Krueger Date of Exam: 02/13/2020 Medical Rec #:  [id number]          Height:       62.0 in Accession #:    [id number]         Weight:  172.0 lb Date of Birth:  [birth year]         BSA:          1.793 m Patient Age:    67 years           BP:           124/69 mmHg Patient Gender: F                  HR:           96 bpm. Exam Location:  [facility] Procedure: 2D Echo, Color Doppler and Cardiac Doppler Indications:     R06.00 Dyspnea  History:         Patient has no prior history of Echocardiogram examinations.                  Risk Factors:Hypertension. Pt tested positive for COVID-19 on                  02/11/20.  Sonographer:     Charmayne Sheer RDCS (AE) Referring Phys:  [id number] Kate Sable Diagnosing Phys: Ida Rogue MD IMPRESSIONS  1. Left ventricular ejection fraction, by estimation, is 55%. The left ventricle has normal function. The left ventricle has no regional wall motion abnormalities. There is mild left ventricular hypertrophy. Left ventricular diastolic parameters are consistent with Grade II diastolic dysfunction (pseudonormalization).  2. Right ventricular systolic function is normal. The  right ventricular size is normal. There is moderately elevated pulmonary artery systolic pressure. The estimated right ventricular systolic pressure is 53.2 mmHg.  3. Left atrial size was mildly dilated.  4. Mild mitral valve regurgitation.  5. The inferior vena cava is dilated in size with >50% respiratory variability, suggesting right atrial pressure of 8 mmHg. FINDINGS  Left Ventricle: Left ventricular ejection fraction, by estimation, is 55 to 60%. The left ventricle has normal function. The left ventricle has no regional wall motion abnormalities. The left ventricular internal cavity size was normal in size. There is  mild left ventricular hypertrophy. Left ventricular diastolic parameters are consistent with Grade II diastolic dysfunction (pseudonormalization). Right Ventricle: The right ventricular size is normal. No increase in right ventricular wall thickness. Right ventricular systolic function is normal. There is moderately elevated pulmonary artery systolic pressure. The tricuspid regurgitant velocity is 3.23 m/s, and with an assumed right atrial pressure of 10 mmHg, the estimated right ventricular systolic pressure is 99.2 mmHg. Left Atrium: Left atrial size was mildly dilated. Right Atrium: Right atrial size was normal in size. Pericardium: There is no evidence of pericardial effusion. Mitral Valve: The mitral valve is normal in structure. Normal mobility of the mitral valve leaflets. Mild mitral valve regurgitation. No evidence of mitral valve stenosis. MV peak gradient, 7.1 mmHg. The mean mitral valve gradient is 4.0 mmHg. Tricuspid Valve: The tricuspid valve is normal in structure. Tricuspid valve regurgitation is mild . No evidence of tricuspid stenosis. Aortic Valve: The aortic valve is normal in structure. Aortic valve regurgitation is not visualized. No aortic stenosis is present. Aortic valve mean gradient measures 5.0 mmHg. Aortic valve peak gradient measures 11.3 mmHg. Aortic valve area, by  VTI measures 1.64 cm. Pulmonic Valve: The pulmonic valve was normal in structure. Pulmonic valve regurgitation is not visualized. No evidence of pulmonic stenosis. Aorta: The aortic root is normal in size and structure. Venous: The inferior vena cava is dilated in size with greater than 50% respiratory variability, suggesting right atrial pressure of 8 mmHg. IAS/Shunts: No atrial level shunt detected  by color flow Doppler.  LEFT VENTRICLE PLAX 2D LVIDd:         5.82 cm     Diastology LVIDs:         4.27 cm     LV e' lateral:   6.85 cm/s LV PW:         1.12 cm     LV E/e' lateral: 16.1 LV IVS:        0.92 cm     LV e' medial:    4.13 cm/s LVOT diam:     1.90 cm     LV E/e' medial:  26.6 LV SV:         50 LV SV Index:   28 LVOT Area:     2.84 cm  LV Volumes (MOD) LV vol d, MOD A2C: 85.9 ml LV vol d, MOD A4C: 66.4 ml LV vol s, MOD A2C: 51.1 ml LV vol s, MOD A4C: 38.2 ml LV SV MOD A2C:     34.8 ml LV SV MOD A4C:     66.4 ml LV SV MOD BP:      33.7 ml RIGHT VENTRICLE RV Basal diam:  3.45 cm LEFT ATRIUM             Index       RIGHT ATRIUM           Index LA diam:        4.60 cm 2.57 cm/m  RA Area:     14.80 cm LA Vol (A2C):   80.4 ml 44.84 ml/m RA Volume:   34.90 ml  19.46 ml/m LA Vol (A4C):   58.0 ml 32.35 ml/m LA Biplane Vol: 70.6 ml 39.38 ml/m  AORTIC VALVE                    PULMONIC VALVE AV Area (Vmax):    1.91 cm     PV Vmax:       1.19 m/s AV Area (Vmean):   2.03 cm     PV Vmean:      86.500 cm/s AV Area (VTI):     1.64 cm     PV VTI:        0.228 m AV Vmax:           168.00 cm/s  PV Peak grad:  5.7 mmHg AV Vmean:          101.000 cm/s PV Mean grad:  3.0 mmHg AV VTI:            0.306 m AV Peak Grad:      11.3 mmHg AV Mean Grad:      5.0 mmHg LVOT Vmax:         113.00 cm/s LVOT Vmean:        72.200 cm/s LVOT VTI:          0.177 m LVOT/AV VTI ratio: 0.58  AORTA Ao Root diam: 3.00 cm MITRAL VALVE                TRICUSPID VALVE MV Area (PHT): 3.60 cm     TR Peak grad:   41.7 mmHg MV Peak grad:  7.1 mmHg      TR Vmax:        323.00 cm/s MV Mean grad:  4.0 mmHg MV Vmax:       1.33 m/s     SHUNTS MV Vmean:      91.5 cm/s    Systemic VTI:  0.18 m  MV Decel Time: 211 msec     Systemic Diam: 1.90 cm MV E velocity: 110.00 cm/s MV A velocity: 91.20 cm/s MV E/A ratio:  1.21 Ida Rogue MD Electronically signed by Ida Rogue MD Signature Date/Time: 02/13/2020/4:55:24 PM    Final     Labs: Basic Metabolic Panel: Recent Labs  Lab 02/13/20 0414 02/13/20 0414 02/14/20 7824 02/14/20 2353 02/15/20 6144 02/16/20 1652 02/17/20 1628 02/18/20 0602 02/19/20 0624  NA 141   < > 141   < > 139 137 136 138 138  K 3.5   < > 3.3*   < > 4.4 4.0 3.8 3.7 3.5  CL 104   < > 103   < > 101 99 99 101 101  CO2 27   < > 28   < > 28 28 25 23 27   GLUCOSE 94   < > 94   < > 136* 112* 191* 180* 180*  BUN 14   < > 13   < > 14 18 18 17 17   CREATININE 0.67   < > 0.64   < > 0.83 0.83 0.69 0.71 0.66  CALCIUM 8.8*   < > 8.5*   < > 8.9 8.8* 8.2* 8.8* 8.5*  MG 1.9  --  1.9  --  2.0  --   --   --   --   PHOS 2.9  --  3.3  --  4.0  --   --   --   --    < > = values in this interval not displayed.   Liver Function Tests: Recent Labs  Lab 02/14/20 0609 02/15/20 0556 02/17/20 1628 02/18/20 0602 02/19/20 0624  AST 20 20 29  34 28  ALT 15 17 16 22 18   ALKPHOS 58 74 71 72 60  BILITOT 0.6 0.5 0.7 0.6 0.5  PROT 6.3* 6.9 7.0 7.3 6.6  ALBUMIN 3.6 3.9 3.7 3.9 3.4*   No results for input(s): LIPASE, AMYLASE in the last 168 hours. No results for input(s): AMMONIA in the last 168 hours. CBC: Recent Labs  Lab 02/13/20 0414 02/13/20 0414 02/14/20 0609 02/15/20 0556 02/16/20 1652 02/18/20 0602 02/19/20 0624  WBC 3.8*   < > 4.5 4.6 5.2 5.5 8.1  NEUTROABS 2.3  --  2.8 3.8  --  4.7 7.3  HGB 11.1*   < > 11.3* 12.3 12.5 13.4 12.4  HCT 34.0*   < > 35.1* 37.3 39.7 41.1 37.9  MCV 84.0   < > 84.0 82.7 85.0 83.4 83.1  PLT 200   < > 196 208 204 210 193   < > = values in this interval not displayed.   Cardiac Enzymes: No results for  input(s): CKTOTAL, CKMB, CKMBINDEX, TROPONINI in the last 168 hours. BNP: BNP (last 3 results) Recent Labs    10/10/19 2120 02/11/20 0506 02/17/20 0226  BNP 68.0 293.1* 178.7*     Time spent: 30 minutes  Signed:  Para Skeans MD.  [facility] 02/19/2020, 2:38 PM

## 2020-02-19 NOTE — Discharge Instructions (Signed)
10 Things You Can Do to Manage Your COVID-19  COVID-19: Quarantine vs. Isolation QUARANTINE keeps someone who was in close contact with someone who has COVID-19 away from others. If you had close contact with a person who has COVID-19  Stay home until 14 days after your last contact.  Check your temperature twice a day and watch for symptoms of COVID-19.  If possible, stay away from people who are at higher-risk for getting very sick from COVID-19. ISOLATION keeps someone who is sick or tested positive for COVID-19 without symptoms away from others, even in their own home. If you are sick and think or know you have COVID-19  Stay home until after ? At least 10 days since symptoms first appeared and ? At least 24 hours with no fever without fever-reducing medication and ? Symptoms have improved If you tested positive for COVID-19 but do not have symptoms  Stay home until after ? 10 days have passed since your positive test If you live with others, stay in a specific "sick room" or area and away from other people or animals, including pets. Use a separate bathroom, if available. [email or web address] 12/27/2018 This information is not intended to replace advice given to you by your health care provider. Make sure you discuss any questions you have with your health care provider. Document Revised: 05/12/2019 Document Reviewed: 05/12/2019 Elsevier Patient Education  [place]. Symptoms at Home If you have possible or confirmed COVID-19: 1. Stay home from work and school. And stay away from other public places. If you must go out, avoid using any kind of public transportation, ridesharing, or taxis. 2. Monitor your symptoms carefully. If your symptoms get worse, call your healthcare provider immediately. 3. Get rest and stay hydrated. 4. If you have a medical appointment, call the healthcare provider ahead of time and tell them that you have or may have COVID-19. 5. For medical  emergencies, call 911 and notify the dispatch personnel that you have or may have COVID-19. 6. Cover your cough and sneezes with a tissue or use the inside of your elbow. 7. Wash your hands often with soap and water for at least 20 seconds or clean your hands with an alcohol-based hand sanitizer that contains at least 60% alcohol. 8. As much as possible, stay in a specific room and away from other people in your home. Also, you should use a separate bathroom, if available. If you need to be around other people in or outside of the home, wear a mask. 9. Avoid sharing personal items with other people in your household, like dishes, towels, and bedding. 10. Clean all surfaces that are touched often, like counters, tabletops, and doorknobs. Use household cleaning sprays or wipes according to the label instructions. [email or web address] 12/08/2018 This information is not intended to replace advice given to you by your health care provider. Make sure you discuss any questions you have with your health care provider. Document Revised: 05/12/2019 Document Reviewed: 05/12/2019 Elsevier Patient Education  2020 [employer].   COVID-19 COVID-19 is a respiratory infection that is caused by a virus called severe acute respiratory syndrome coronavirus 2 (SARS-CoV-2). The disease is also known as coronavirus disease or novel coronavirus. In some people, the virus may not cause any symptoms. In others, it may cause a serious infection. The infection can get worse quickly and can lead to complications, such as:  Pneumonia, or infection of the lungs.  Acute respiratory distress syndrome or ARDS. This is a condition in  which fluid build-up in the lungs prevents the lungs from filling with air and passing oxygen into the blood.  Acute respiratory failure. This is a condition in which there is not enough oxygen passing from the lungs to the body or when carbon dioxide is not passing from the lungs out of the  body.  Sepsis or septic shock. This is a serious bodily reaction to an infection.  Blood clotting problems.  Secondary infections due to bacteria or fungus.  Organ failure. This is when your body's organs stop working. The virus that causes COVID-19 is contagious. This means that it can spread from person to person through droplets from coughs and sneezes (respiratory secretions). What are the causes? This illness is caused by a virus. You may catch the virus by:  Breathing in droplets from an infected person. Droplets can be spread by a person breathing, speaking, singing, coughing, or sneezing.  Touching something, like a table or a doorknob, that was exposed to the virus (contaminated) and then touching your mouth, nose, or eyes. What increases the risk? Risk for infection You are more likely to be infected with this virus if you:  Are within 6 feet (2 meters) of a person with COVID-19.  Provide care for or live with a person who is infected with COVID-19.  Spend time in crowded indoor spaces or live in shared housing. Risk for serious illness You are more likely to become seriously ill from the virus if you:  Are 43 years of age or older. The higher your age, the more you are at risk for serious illness.  Live in a nursing home or long-term care facility.  Have cancer.  Have a long-term (chronic) disease such as: ? Chronic lung disease, including chronic obstructive pulmonary disease or asthma. ? A long-term disease that lowers your body's ability to fight infection (immunocompromised). ? Heart disease, including heart failure, a condition in which the arteries that lead to the heart become narrow or blocked (coronary artery disease), a disease which makes the heart muscle thick, weak, or stiff (cardiomyopathy). ? Diabetes. ? Chronic kidney disease. ? Sickle cell disease, a condition in which red blood cells have an abnormal "sickle" shape. ? Liver disease.  Are  obese. What are the signs or symptoms? Symptoms of this condition can range from mild to severe. Symptoms may appear any time from 2 to 14 days after being exposed to the virus. They include:  A fever or chills.  A cough.  Difficulty breathing.  Headaches, body aches, or muscle aches.  Runny or stuffy (congested) nose.  A sore throat.  New loss of taste or smell. Some people may also have stomach problems, such as nausea, vomiting, or diarrhea. Other people may not have any symptoms of COVID-19. How is this diagnosed? This condition may be diagnosed based on:  Your signs and symptoms, especially if: ? You live in an area with a COVID-19 outbreak. ? You recently traveled to or from an area where the virus is common. ? You provide care for or live with a person who was diagnosed with COVID-19. ? You were exposed to a person who was diagnosed with COVID-19.  A physical exam.  Lab tests, which may include: ? Taking a sample of fluid from the back of your nose and throat (nasopharyngeal fluid), your nose, or your throat using a swab. ? A sample of mucus from your lungs (sputum). ? Blood tests.  Imaging tests, which may include, X-rays, CT scan,  or ultrasound. How is this treated? At present, there is no medicine to treat COVID-19. Medicines that treat other diseases are being used on a trial basis to see if they are effective against COVID-19. Your health care provider will talk with you about ways to treat your symptoms. For most people, the infection is mild and can be managed at home with rest, fluids, and over-the-counter medicines. Treatment for a serious infection usually takes places in a hospital intensive care unit (ICU). It may include one or more of the following treatments. These treatments are given until your symptoms improve.  Receiving fluids and medicines through an IV.  Supplemental oxygen. Extra oxygen is given through a tube in the nose, a face mask, or a  hood.  Positioning you to lie on your stomach (prone position). This makes it easier for oxygen to get into the lungs.  Continuous positive airway pressure (CPAP) or bi-level positive airway pressure (BPAP) machine. This treatment uses mild air pressure to keep the airways open. A tube that is connected to a motor delivers oxygen to the body.  Ventilator. This treatment moves air into and out of the lungs by using a tube that is placed in your windpipe.  Tracheostomy. This is a procedure to create a hole in the neck so that a breathing tube can be inserted.  Extracorporeal membrane oxygenation (ECMO). This procedure gives the lungs a chance to recover by taking over the functions of the heart and lungs. It supplies oxygen to the body and removes carbon dioxide. Follow these instructions at home: Lifestyle  If you are sick, stay home except to get medical care. Your health care provider will tell you how long to stay home. Call your health care provider before you go for medical care.  Rest at home as told by your health care provider.  Do not use any products that contain nicotine or tobacco, such as cigarettes, e-cigarettes, and chewing tobacco. If you need help quitting, ask your health care provider.  Return to your normal activities as told by your health care provider. Ask your health care provider what activities are safe for you. General instructions  Take over-the-counter and prescription medicines only as told by your health care provider.  Drink enough fluid to keep your urine pale yellow.  Keep all follow-up visits as told by your health care provider. This is important. How is this prevented?  There is no vaccine to help prevent COVID-19 infection. However, there are steps you can take to protect yourself and others from this virus. To protect yourself:   Do not travel to areas where COVID-19 is a risk. The areas where COVID-19 is reported change often. To identify  high-risk areas and travel restrictions, check the CDC travel website: FatFares.com.br  If you live in, or must travel to, an area where COVID-19 is a risk, take precautions to avoid infection. ? Stay away from people who are sick. ? Wash your hands often with soap and water for 20 seconds. If soap and water are not available, use an alcohol-based hand sanitizer. ? Avoid touching your mouth, face, eyes, or nose. ? Avoid going out in public, follow guidance from your state and local health authorities. ? If you must go out in public, wear a cloth face covering or face mask. Make sure your mask covers your nose and mouth. ? Avoid crowded indoor spaces. Stay at least 6 feet (2 meters) away from others. ? Disinfect objects and surfaces that are frequently  touched every day. This may include:  Counters and tables.  Doorknobs and light switches.  Sinks and faucets.  Electronics, such as phones, remote controls, keyboards, computers, and tablets. To protect others: If you have symptoms of COVID-19, take steps to prevent the virus from spreading to others.  If you think you have a COVID-19 infection, contact your health care provider right away. Tell your health care team that you think you may have a COVID-19 infection.  Stay home. Leave your house only to seek medical care. Do not use public transport.  Do not travel while you are sick.  Wash your hands often with soap and water for 20 seconds. If soap and water are not available, use alcohol-based hand sanitizer.  Stay away from other members of your household. Let healthy household members care for children and pets, if possible. If you have to care for children or pets, wash your hands often and wear a mask. If possible, stay in your own room, separate from others. Use a different bathroom.  Make sure that all people in your household wash their hands well and often.  Cough or sneeze into a tissue or your sleeve or elbow.  Do not cough or sneeze into your hand or into the air.  Wear a cloth face covering or face mask. Make sure your mask covers your nose and mouth. Where to find more information  Centers for Disease Control and Prevention: PurpleGadgets.be  World Health Organization: https://www.castaneda.info/ Contact a health care provider if:  You live in or have traveled to an area where COVID-19 is a risk and you have symptoms of the infection.  You have had contact with someone who has COVID-19 and you have symptoms of the infection. Get help right away if:  You have trouble breathing.  You have pain or pressure in your chest.  You have confusion.  You have bluish lips and fingernails.  You have difficulty waking from sleep.  You have symptoms that get worse. These symptoms may represent a serious problem that is an emergency. Do not wait to see if the symptoms will go away. Get medical help right away. Call your local emergency services (911 in the [place] Do not drive yourself to the hospital. Let the emergency medical personnel know if you think you have COVID-19. Summary  COVID-19 is a respiratory infection that is caused by a virus. It is also known as coronavirus disease or novel coronavirus. It can cause serious infections, such as pneumonia, acute respiratory distress syndrome, acute respiratory failure, or sepsis.  The virus that causes COVID-19 is contagious. This means that it can spread from person to person through droplets from breathing, speaking, singing, coughing, or sneezing.  You are more likely to develop a serious illness if you are 20 years of age or older, have a weak immune system, live in a nursing home, or have chronic disease.  There is no medicine to treat COVID-19. Your health care provider will talk with you about ways to treat your symptoms.  Take steps to protect yourself and others from infection. Wash your hands often and  disinfect objects and surfaces that are frequently touched every day. Stay away from people who are sick and wear a mask if you are sick. This information is not intended to replace advice given to you by your health care provider. Make sure you discuss any questions you have with your health care provider. Document Revised: 03/25/2019 Document Reviewed: 07/01/2018 Elsevier Patient Education  [phone]  [employer].

## 2020-02-19 NOTE — Progress Notes (Signed)
SATURATION QUALIFICATIONS: (This note is used to comply with regulatory documentation for home oxygen)  Patient Saturations on Room Air at Rest = 97%  Patient Saturations on Room Air while Ambulating = 91-96%

## 2020-03-19 ENCOUNTER — Other Ambulatory Visit: Payer: Self-pay

## 2020-11-27 ENCOUNTER — Other Ambulatory Visit: Payer: Self-pay | Admitting: Internal Medicine

## 2020-11-27 DIAGNOSIS — R270 Ataxia, unspecified: Secondary | ICD-10-CM

## 2020-11-27 DIAGNOSIS — R410 Disorientation, unspecified: Secondary | ICD-10-CM

## 2021-02-25 ENCOUNTER — Emergency Department: Payer: Medicare Other

## 2021-02-25 ENCOUNTER — Encounter: Payer: Self-pay | Admitting: Emergency Medicine

## 2021-02-25 ENCOUNTER — Other Ambulatory Visit: Payer: Self-pay

## 2021-02-25 DIAGNOSIS — Z8616 Personal history of COVID-19: Secondary | ICD-10-CM | POA: Insufficient documentation

## 2021-02-25 DIAGNOSIS — I110 Hypertensive heart disease with heart failure: Secondary | ICD-10-CM | POA: Diagnosis not present

## 2021-02-25 DIAGNOSIS — I509 Heart failure, unspecified: Secondary | ICD-10-CM | POA: Diagnosis not present

## 2021-02-25 DIAGNOSIS — B029 Zoster without complications: Secondary | ICD-10-CM | POA: Insufficient documentation

## 2021-02-25 DIAGNOSIS — R0602 Shortness of breath: Secondary | ICD-10-CM | POA: Diagnosis present

## 2021-02-25 LAB — CBC WITH DIFFERENTIAL/PLATELET
Abs Immature Granulocytes: 0.02 10*3/uL (ref 0.00–0.07)
Basophils Absolute: 0 10*3/uL (ref 0.0–0.1)
Basophils Relative: 0 %
Eosinophils Absolute: 0.2 10*3/uL (ref 0.0–0.5)
Eosinophils Relative: 3 %
HCT: 40.1 % (ref 36.0–46.0)
Hemoglobin: 12.6 g/dL (ref 12.0–15.0)
Immature Granulocytes: 0 %
Lymphocytes Relative: 28 %
Lymphs Abs: 1.5 10*3/uL (ref 0.7–4.0)
MCH: 28.5 pg (ref 26.0–34.0)
MCHC: 31.4 g/dL (ref 30.0–36.0)
MCV: 90.7 fL (ref 80.0–100.0)
Monocytes Absolute: 0.5 10*3/uL (ref 0.1–1.0)
Monocytes Relative: 9 %
Neutro Abs: 3.3 10*3/uL (ref 1.7–7.7)
Neutrophils Relative %: 60 %
Platelets: 167 10*3/uL (ref 150–400)
RBC: 4.42 MIL/uL (ref 3.87–5.11)
RDW: 14 % (ref 11.5–15.5)
WBC: 5.5 10*3/uL (ref 4.0–10.5)
nRBC: 0 % (ref 0.0–0.2)

## 2021-02-25 LAB — URINALYSIS, COMPLETE (UACMP) WITH MICROSCOPIC
Bilirubin Urine: NEGATIVE
Glucose, UA: NEGATIVE mg/dL
Hgb urine dipstick: NEGATIVE
Ketones, ur: NEGATIVE mg/dL
Leukocytes,Ua: NEGATIVE
Nitrite: NEGATIVE
Protein, ur: NEGATIVE mg/dL
Specific Gravity, Urine: 1.015 (ref 1.005–1.030)
WBC, UA: NONE SEEN WBC/hpf (ref 0–5)
pH: 5.5 (ref 5.0–8.0)

## 2021-02-25 LAB — COMPREHENSIVE METABOLIC PANEL
ALT: 21 U/L (ref 0–44)
AST: 24 U/L (ref 15–41)
Albumin: 4 g/dL (ref 3.5–5.0)
Alkaline Phosphatase: 70 U/L (ref 38–126)
Anion gap: 4 — ABNORMAL LOW (ref 5–15)
BUN: 23 mg/dL (ref 8–23)
CO2: 29 mmol/L (ref 22–32)
Calcium: 8.8 mg/dL — ABNORMAL LOW (ref 8.9–10.3)
Chloride: 107 mmol/L (ref 98–111)
Creatinine, Ser: 0.99 mg/dL (ref 0.44–1.00)
GFR, Estimated: 57 mL/min — ABNORMAL LOW (ref >60)
Glucose, Bld: 109 mg/dL — ABNORMAL HIGH (ref 70–99)
Potassium: 3.8 mmol/L (ref 3.5–5.1)
Sodium: 140 mmol/L (ref 135–145)
Total Bilirubin: 0.7 mg/dL (ref 0.3–1.2)
Total Protein: 6.6 g/dL (ref 6.5–8.1)

## 2021-02-25 LAB — BRAIN NATRIURETIC PEPTIDE: B Natriuretic Peptide: 867.2 pg/mL — ABNORMAL HIGH (ref 0.0–100.0)

## 2021-02-25 LAB — TROPONIN I (HIGH SENSITIVITY): Troponin I (High Sensitivity): 91 ng/L — ABNORMAL HIGH (ref <18)

## 2021-02-25 NOTE — ED Notes (Signed)
Patient called x3 with no answer, but finally located by tech for vitals resulting in delayed EKG/triage.

## 2021-02-25 NOTE — ED Notes (Signed)
No answer when called several times from lobby 

## 2021-02-25 NOTE — ED Notes (Signed)
No answer when called several times from lobby; phone # listed in chart not working

## 2021-02-25 NOTE — ED Notes (Signed)
Pt called to obtain VS but no answer at this time.

## 2021-02-25 NOTE — ED Triage Notes (Signed)
Pt in via POV, reports worsening shortness of breath since Friday.  Also reports ongoing swelling to bilateral legs/feet x a couple months, currently taking diuretic but without any relief.  NAD noted at this time.

## 2021-02-25 NOTE — ED Provider Notes (Signed)
Emergency Medicine Provider Triage Evaluation Note  Sheila Krueger , a 86 y.o. female  was evaluated in triage.  Pt complains of shortness of breath and lower extremity swelling. She also has has burning pain to the left breast and back that has been there x 3 days.  Review of Systems  Positive: Shortness of Breath, Burning sensation to skin Negative: Chest pain  Physical Exam  BP (!) 148/85 (BP Location: Left Arm)   Pulse 95   Temp 97.6 F (36.4 C) (Oral)   Resp 18   SpO2 98%  Gen:   Awake, no distress   Resp:  Normal effort  MSK:   Moves extremities without difficulty  Other:  Vesicular rash to left breast and left thorax  Medical Decision Making  Medically screening exam initiated at 7:05 PM.  Appropriate orders placed.  Sheila Krueger was informed that the remainder of the evaluation will be completed by another provider, this initial triage assessment does not replace that evaluation, and the importance of remaining in the ED until their evaluation is complete.   Victorino Dike, FNP 02/25/21 1911    Vladimir Crofts, MD 02/25/21 1929

## 2021-02-26 ENCOUNTER — Emergency Department
Admission: EM | Admit: 2021-02-26 | Discharge: 2021-02-26 | Disposition: A | Discharge status: Home / Self Care | Payer: Medicare Other | Attending: Emergency Medicine | Admitting: Emergency Medicine

## 2021-02-26 DIAGNOSIS — B029 Zoster without complications: Secondary | ICD-10-CM

## 2021-02-26 DIAGNOSIS — I509 Heart failure, unspecified: Secondary | ICD-10-CM

## 2021-02-26 DIAGNOSIS — R0602 Shortness of breath: Secondary | ICD-10-CM

## 2021-02-26 DIAGNOSIS — I110 Hypertensive heart disease with heart failure: Secondary | ICD-10-CM | POA: Diagnosis not present

## 2021-02-26 LAB — TROPONIN I (HIGH SENSITIVITY): Troponin I (High Sensitivity): 98 ng/L — ABNORMAL HIGH (ref <18)

## 2021-02-26 MED ORDER — ACYCLOVIR 400 MG PO TABS: 800.0000 mg | ORAL_TABLET | Freq: Every day | ORAL | 0 refills | Status: AC

## 2021-02-26 MED ORDER — LIDOCAINE 5 % EX PTCH: 1.0000 | MEDICATED_PATCH | Freq: Two times a day (BID) | CUTANEOUS | 0 refills | Status: AC

## 2021-02-26 MED ORDER — LIDOCAINE 5 % EX PTCH
1.0000 | MEDICATED_PATCH | CUTANEOUS | Status: DC
  Administered 2021-02-26: 1 via TRANSDERMAL
  Filled 2021-02-26: qty 1

## 2021-02-26 MED ORDER — FUROSEMIDE 10 MG/ML IJ SOLN: 40.0000 mg | Freq: Once | INTRAMUSCULAR | Status: DC

## 2021-02-26 MED ORDER — IPRATROPIUM-ALBUTEROL 0.5-2.5 (3) MG/3ML IN SOLN
3.0000 mL | Freq: Once | RESPIRATORY_TRACT | Status: AC
  Administered 2021-02-26: 3 mL via RESPIRATORY_TRACT
  Filled 2021-02-26: qty 3

## 2021-02-26 MED ORDER — FUROSEMIDE 10 MG/ML IJ SOLN
80.0000 mg | Freq: Once | INTRAMUSCULAR | Status: AC
  Administered 2021-02-26: 80 mg via INTRAVENOUS
  Filled 2021-02-26: qty 8

## 2021-02-26 NOTE — ED Notes (Signed)
See triage note  presents with some SOB with exertion  also developed some pain under left breast with some blisters    sx's started yesterday

## 2021-02-26 NOTE — ED Provider Notes (Signed)
[facility] Emergency Department Provider Note   ____________________________________________   Event Date/Time   First MD Initiated Contact with Patient 02/26/21 [phone]     (approximate)  I have reviewed the triage vital signs and the nursing notes.   HISTORY  Chief Complaint Shortness of Breath    HPI Sheila Krueger is a 86 y.o. female with past medical history of hypertension, hyperlipidemia, and CNS lymphoma who presents to the ED complaining of shortness of breath.  Patient reports that she has been having increasing difficulty breathing for about the past month that is associated with swelling in her legs.  She states she has seen her PCP for this problem and was prescribed torsemide, which she has been taking daily since then.  She states that it makes her pee and she has seen occasional decrease in her leg swelling, however shortness of breath remains.  She denies any fevers or cough, has not had any pain in her legs.  She denies any history of COPD or CHF.  She also reports 24 to 48 hours of burning discomfort starting in the center of her chest and moving around to the middle of her back.  This pain is constant and seems to be worse to touch.        Past Medical History:  Diagnosis Date   Aneurysm ([place]) 2018   brain   Arthritis    CNS lymphoma ([place])    Hypertension     Patient Active Problem List   Diagnosis Date Noted   Acute hypoxemic respiratory failure due to COVID-19 ([place]) 02/17/2020   Cough 02/17/2020   Fever 02/17/2020   SIRS (systemic inflammatory response syndrome) ([place]) 02/17/2020   Wheeze    Hyperlipidemia    Pneumonia due to COVID-19 virus 02/11/2020   Elevated troponin 02/11/2020   Hypertension    SOB (shortness of breath)    Goals of care, counseling/discussion 07/12/2019   CNS lymphoma ([place]) 07/12/2019    History reviewed. No pertinent surgical history.  Prior to Admission medications   Medication Sig Start Date  End Date Taking? Authorizing Provider  acyclovir (ZOVIRAX) 400 MG tablet Take 2 tablets (800 mg total) by mouth 5 (five) times daily for 7 days. 02/26/21 03/05/21 Yes Blake Divine, MD  lidocaine (LIDODERM) 5 % Place 1 patch onto the skin every 12 (twelve) hours. Remove & Discard patch within 12 hours or as directed by MD 02/26/21 02/26/22 Yes Blake Divine, MD  acetaminophen (TYLENOL 8 HOUR ARTHRITIS PAIN) 650 MG CR tablet Take 650 mg by mouth every 8 (eight) hours as needed for pain.    [provider]  albuterol (VENTOLIN HFA) 108 (90 Base) MCG/ACT inhaler Inhale 2 puffs into the lungs every 6 (six) hours. 02/13/20   Loletha Grayer, MD  ascorbic acid (VITAMIN C) 500 MG tablet Take 1 tablet (500 mg total) by mouth daily. 02/14/20   Wieting, Richard, MD  bumetanide (BUMEX) 1 MG tablet Take 1 mg by mouth daily. 01/27/20   [provider]  [employer] MELATONIN 3 MG TABS tablet Take 3 mg by mouth at bedtime as needed.  11/29/19   [provider]  ferrous sulfate 325 (65 FE) MG EC tablet Take 1 tablet by mouth every morning. 01/20/20   [provider]  Multiple Vitamin (MULTIVITAMIN) capsule Take 1 capsule by mouth daily.    [provider]  pantoprazole (PROTONIX) 20 MG tablet Take 1 tablet (20 mg total) by mouth daily. 07/11/19   Janese Banks,  Weston Anna, MD  potassium chloride (KLOR-CON) 10 MEQ tablet Take 10 mEq by mouth daily. 01/27/20   [provider]  rosuvastatin (CRESTOR) 10 MG tablet Take 1 tablet by mouth daily. 11/30/19   [provider]  traZODone (DESYREL) 50 MG tablet Take 50 mg by mouth at bedtime as needed for sleep. 01/15/20   [provider]  zinc sulfate 220 (50 Zn) MG capsule Take 1 capsule (220 mg total) by mouth daily. 02/14/20   Loletha Grayer, MD    Allergies Aspirin  Family History  Problem Relation Age of Onset   Hypertension Sister     Social History Social History   Tobacco Use   Smoking status: Never   Smokeless  tobacco: Never  Vaping Use   Vaping Use: Never used  Substance Use Topics   Alcohol use: Not Currently   Drug use: Not Currently    Review of Systems  Constitutional: No fever/chills Eyes: No visual changes. ENT: No sore throat. Cardiovascular: Positive for chest pain. Respiratory: Positive for shortness of breath. Gastrointestinal: No abdominal pain.  No nausea, no vomiting.  No diarrhea.  No constipation. Genitourinary: Negative for dysuria. Musculoskeletal: Positive for back pain and leg swelling. Skin: Negative for rash. Neurological: Negative for headaches, focal weakness or numbness.  ____________________________________________   PHYSICAL EXAM:  VITAL SIGNS: ED Triage Vitals  Enc Vitals Group     BP 02/25/21 1853 (!) 148/85     Pulse Rate 02/25/21 1853 95     Resp 02/25/21 1853 18     Temp 02/25/21 1853 97.6 F (36.4 C)     Temp Source 02/25/21 1853 Oral     SpO2 02/25/21 1853 98 %     Weight 02/25/21 1906 175 lb (79.4 kg)     Height 02/25/21 1906 5\' 2"  (1.575 m)     Head Circumference --      Peak Flow --      Pain Score 02/25/21 1906 0     Pain Loc --      Pain Edu? --      Excl. in [place]? --     Constitutional: Alert and oriented. Eyes: Conjunctivae are normal. Head: Atraumatic. Nose: No congestion/rhinnorhea. Mouth/Throat: Mucous membranes are moist. Neck: Normal ROM Cardiovascular: Normal rate, regular rhythm. Grossly normal heart sounds.  2+ radial pulses bilaterally. Respiratory: Normal respiratory effort.  No retractions. Lungs CTAB. Gastrointestinal: Soft and nontender. No distention. Genitourinary: deferred Musculoskeletal: 2+ pitting edema to knees bilaterally with no associated calf tenderness. Neurologic:  Normal speech and language. No gross focal neurologic deficits are appreciated. Skin:  Skin is warm, dry and intact.  Patchy, vesicular, and erythematous rash noted under left breast and extending to left side of mid upper  back. Psychiatric: Mood and affect are normal. Speech and behavior are normal.  ____________________________________________   LABS (all labs ordered are listed, but only abnormal results are displayed)  Labs Reviewed  COMPREHENSIVE METABOLIC PANEL - Abnormal; Notable for the following components:      Result Value   Glucose, Bld 109 (*)    Calcium 8.8 (*)    GFR, Estimated 57 (*)    Anion gap 4 (*)    All other components within normal limits  BRAIN NATRIURETIC PEPTIDE - Abnormal; Notable for the following components:   B Natriuretic Peptide 867.2 (*)    All other components within normal limits  URINALYSIS, COMPLETE (UACMP) WITH MICROSCOPIC - Abnormal; Notable for the following components:   Bacteria, UA RARE (*)  All other components within normal limits  TROPONIN I (HIGH SENSITIVITY) - Abnormal; Notable for the following components:   Troponin I (High Sensitivity) 91 (*)    All other components within normal limits  TROPONIN I (HIGH SENSITIVITY) - Abnormal; Notable for the following components:   Troponin I (High Sensitivity) 98 (*)    All other components within normal limits  CBC WITH DIFFERENTIAL/PLATELET   ____________________________________________  EKG  ED ECG REPORT I, Blake Divine, the attending physician, personally viewed and interpreted this ECG.   Date: 02/26/2021  EKG Time: 19:18  Rate: 94  Rhythm: normal sinus rhythm  Axis: Normal  Intervals:none  ST&T Change: None   PROCEDURES  Procedure(s) performed (including Critical Care):  Procedures   ____________________________________________   INITIAL IMPRESSION / ASSESSMENT AND PLAN / ED COURSE      86 year old female with past medical history of hypertension, hyperlipidemia, and CNS lymphoma who presents to the ED complaining of 1 month of increasing leg swelling along with shortness of breath along with 1 to 2 days of burning discomfort extending from her upper chest to left upper back.   Patient has a rash in the area of chest and back discomfort that is consistent with shingles, plan to treat with Lidoderm patches and antiviral medication.  EKG shows no evidence of arrhythmia or ischemia, troponin is mildly elevated but stable on recheck and I doubt ACS or PE.  Chest x-ray reviewed by me and shows no infiltrate, edema, or effusion.  Patient does appear fluid overloaded with elevated BNP concerning for CHF.  We will trial IV diuresis and reassess, if patient improves she would be appropriate for discharge home with close follow-up in the CHF clinic.  Patient reports feeling better following IV Lasix and subsequent diuresis.  She is appropriate for discharge home and was counseled to increase her torsemide to twice daily for the next 5 days.  She was provided referral to the CHF clinic and counseled to return to the ED for new worsening symptoms.  She was also prescribed acyclovir and Lidoderm patches for her shingles rash.  Patient agrees with plan.      ____________________________________________   FINAL CLINICAL IMPRESSION(S) / ED DIAGNOSES  Final diagnoses:  Shortness of breath  Acute on chronic congestive heart failure, unspecified heart failure type ([place])  Herpes zoster without complication     ED Discharge Orders          Ordered    acyclovir (ZOVIRAX) 400 MG tablet  5 times daily        02/26/21 1006    lidocaine (LIDODERM) 5 %  Every 12 hours        02/26/21 1006    AMB referral to CHF clinic        02/26/21 1007             Note:  This document was prepared using Dragon voice recognition software and may include unintentional dictation errors.    Blake Divine, MD 02/26/21 1010

## 2021-02-26 NOTE — Discharge Instructions (Addendum)
I am worried that you are retaining fluid and that this is contributing to your difficulty breathing as well as swelling in your legs.  You should take the torsemide twice daily instead of only once for the next 5 days.  You should also be contacted by the heart failure clinic for a follow-up appointment, but if you do not hear from them, please call the number listed in this paperwork.  If you develop worsening difficulty breathing, please return to the ER for reevaluation.  Your rash appears consistent with shingles and you were prescribed acyclovir to take for the next week.  You can use the pain patches to help with pain control and you may also take Tylenol as needed.  Please schedule follow-up with your primary care doctor for reassessment of the shingles.

## 2021-03-01 ENCOUNTER — Ambulatory Visit: Payer: Medicare Other | Admitting: Family

## 2021-03-01 ENCOUNTER — Telehealth: Payer: Self-pay | Admitting: Family

## 2021-03-01 NOTE — Telephone Encounter (Signed)
Patient did not show for her Heart Failure Clinic appointment on 03/01/21 after being unable to reach patient or her niece. Will attempt to reschedule.

## 2021-03-14 NOTE — Progress Notes (Signed)
Patient ID: Sheila Krueger, female    DOB: 1935/01/05, 86 y.o.   MRN: [id number]  HPI  Sheila Krueger is a 86 y/o female with a history of HTN, aneurysm, arthritis, dementia, lymphoma and chronic heart failure.   Echo report from 03/04/21 reviewed and showed an EF of 30% along with mild MR and mild pulmonary HTN.   Admitted 03/03/21 due to acute on chronic HF. Given IV lasix with transition to oral diuretics. Discharged the following day. Was in the ED 02/26/21 due to shortness of breath. Given IV lasix with improvement of her symptoms and she was released.   She presents today for her initial visit with a chief complaint of minimal shortness of breath upon moderate exertion. She says that this has been present for awhile but she couldn't quantify for how long. She has associated fatigue, decreased appetite, pedal edema, palpitations and chronic pain along with this. She denies any dizziness, abdominal distention, chest pain or cough.   Has scales at home but hasn't been using them. Has worn compression socks in the past but says that they are too difficult to put on. Has been taking 40mg  daily of furosemide.   Is not adding as much salt as she used to and says that she's trying to decrease her sodium use. She says that prior to her recent admission, she would add salt to her hot dog before even tasting it. She no longer does that.   Reports that her shingles rash is still present although is improving.   Sleeps in a recliner because that is where she's used to sleeping. She said that before her husband died, he was sleeping in the recliner and she bought a second recliner so that she could sleep beside him and she just got used to sleeping in it.   Past Medical History:  Diagnosis Date   Aneurysm ([place]) 2018   brain   Arthritis    CHF (congestive heart failure) ([place])    CNS lymphoma ([place])    Hypertension    History reviewed. No pertinent surgical history. Family History  Problem Relation  Age of Onset   Hypertension Sister    Social History   Tobacco Use   Smoking status: Never   Smokeless tobacco: Never  Substance Use Topics   Alcohol use: Not Currently   Allergies  Allergen Reactions   Aspirin Other (See Comments)    Tachycardia   Prior to Admission medications   Medication Sig Start Date End Date Taking? Authorizing Provider  albuterol (VENTOLIN HFA) 108 (90 Base) MCG/ACT inhaler Inhale 2 puffs into the lungs every 6 (six) hours. 02/13/20  Yes Wieting, Richard, MD  furosemide (LASIX) 40 MG tablet Take 40 mg by mouth daily.   Yes [provider]  ibuprofen (ADVIL) 100 MG tablet Take 100 mg by mouth every 6 (six) hours as needed for fever. 1-2 for pain   Yes [provider]  lidocaine (LIDODERM) 5 % Place 1 patch onto the skin every 12 (twelve) hours. Remove & Discard patch within 12 hours or as directed by MD 02/26/21 02/26/22 Yes Blake Divine, MD  metoprolol succinate (TOPROL-XL) 25 MG 24 hr tablet Take 12.5 mg by mouth daily.   Yes [provider]  potassium chloride (KLOR-CON) 10 MEQ tablet Take 10 mEq by mouth daily. 01/27/20  Yes [provider]  acetaminophen (TYLENOL) 650 MG CR tablet Take 650 mg by mouth every 8 (eight) hours as needed for pain. Patient not taking:  Reported on 03/15/2021    [provider]  ascorbic acid (VITAMIN C) 500 MG tablet Take 1 tablet (500 mg total) by mouth daily. 02/14/20   Loletha Grayer, MD  [employer] MELATONIN 3 MG TABS tablet Take 3 mg by mouth at bedtime as needed.  11/29/19   [provider]  ferrous sulfate 325 (65 FE) MG EC tablet Take 1 tablet by mouth every morning. Patient not taking: Reported on 03/15/2021 01/20/20   [provider]  Multiple Vitamin (MULTIVITAMIN) capsule Take 1 capsule by mouth daily.    [provider]  pantoprazole (PROTONIX) 20 MG tablet Take 1 tablet (20 mg total) by mouth daily. 07/11/19   Sindy Guadeloupe, MD  rosuvastatin (CRESTOR) 10 MG  tablet Take 1 tablet by mouth daily. Patient not taking: Reported on 03/15/2021 11/30/19   [provider]  traZODone (DESYREL) 50 MG tablet Take 50 mg by mouth at bedtime as needed for sleep. Patient not taking: Reported on 03/15/2021 01/15/20   [provider]  zinc sulfate 220 (50 Zn) MG capsule Take 1 capsule (220 mg total) by mouth daily. 02/14/20   Loletha Grayer, MD   Review of Systems  Constitutional:  Positive for appetite change (decreased) and fatigue.  HENT:  Negative for congestion, postnasal drip and sore throat.   Eyes: Negative.   Respiratory:  Positive for shortness of breath. Negative for cough.   Cardiovascular:  Positive for palpitations and leg swelling. Negative for chest pain.  Gastrointestinal:  Negative for abdominal distention and abdominal pain.  Endocrine: Negative.   Genitourinary: Negative.   Musculoskeletal:  Positive for back pain and neck pain.  Skin:  Positive for rash.       Shingle rash mid chest and under left breast  Allergic/Immunologic: Negative.   Neurological:  Negative for dizziness and light-headedness.  Hematological:  Negative for adenopathy. Does not bruise/bleed easily.  Psychiatric/Behavioral:  Positive for sleep disturbance (sleeping in recliner for comfort). Negative for dysphoric mood. The patient is not nervous/anxious.    Vitals:   03/15/21 1138  BP: (!) 101/54  Pulse: 94  Resp: 18  SpO2: 98%  Weight: 173 lb 8 oz (78.7 kg)  Height: 5\' 2"  (1.575 m)   Wt Readings from Last 3 Encounters:  03/15/21 173 lb 8 oz (78.7 kg)  02/25/21 175 lb (79.4 kg)  02/17/20 174 lb 6.1 oz (79.1 kg)   Lab Results  Component Value Date   CREATININE 0.99 02/25/2021   CREATININE 0.66 02/19/2020   CREATININE 0.71 02/18/2020    Physical Exam Vitals and nursing note reviewed.  Constitutional:      Appearance: She is well-developed.  HENT:     Head: Normocephalic and atraumatic.  Neck:     Vascular: No JVD.  Cardiovascular:      Rate and Rhythm: Normal rate and regular rhythm.  Pulmonary:     Effort: Pulmonary effort is normal.     Breath sounds: No rhonchi or rales.  Abdominal:     Palpations: Abdomen is soft.     Tenderness: There is no abdominal tenderness.  Musculoskeletal:     Cervical back: Neck supple.     Right lower leg: Edema (1+ pitting) present.     Left lower leg: No tenderness. Edema (1+ pitting) present.  Skin:    General: Skin is warm and dry.  Neurological:     General: No focal deficit present.     Mental Status: She is alert and oriented to person, place,  and time.  Psychiatric:        Mood and Affect: Mood normal.        Behavior: Behavior normal.     Assessment & Plan:  1: Chronic heart failure with reduced ejection fraction- - NYHA class II - euvolemic today - has scales but hasn't been weighing daily; instructed to start weighing daily, write the weight down and call for an overnight weight gain of > 2 pounds or a weekly weight gain of >5 pounds - has been adding salt but says that she's adding less than she used to; reviewed the importance of not adding any salt to her food; low sodium cookbook provided to the patient - she says that she used to even salt her hotdogs but says that she doesn't do this anymore - on GDMT of metoprolol - unsure of BP could tolerate entresto or MRA; consider adding SGLT2 but will get labs today first - BNP 03/04/21 was 402.43  2: HTN- - BP looks good although on the low side (101/54) - saw PCP Ouida Sills) 01/10/21 - BMP 03/04/21 reviewed and showed sodium 140, potassium 3.4, creatinine 0.84 and GFR 69 - will recheck labs today  3: Lymphedema- - stage 2 - does elevate her legs when sitting for long periods of time - has worn compression socks in the past but admits that they are difficult to put on - limited in her ability to exercise due to her symptoms - consider compression boots if edema persists   Patient brought some of her medications today  but didn't bring everything. Encouraged her to bring all her medications to every visit every time.   Return in 1 month or sooner for any questions/problems before then.

## 2021-03-15 ENCOUNTER — Other Ambulatory Visit: Payer: Self-pay

## 2021-03-15 ENCOUNTER — Ambulatory Visit: Payer: Medicare Other | Attending: Family | Admitting: Family

## 2021-03-15 ENCOUNTER — Encounter: Payer: Self-pay | Admitting: Family

## 2021-03-15 VITALS — BP 101/54 | HR 94 | Resp 18 | Ht 62.0 in | Wt 173.5 lb

## 2021-03-15 DIAGNOSIS — Z8249 Family history of ischemic heart disease and other diseases of the circulatory system: Secondary | ICD-10-CM | POA: Diagnosis not present

## 2021-03-15 DIAGNOSIS — Z8572 Personal history of non-Hodgkin lymphomas: Secondary | ICD-10-CM | POA: Insufficient documentation

## 2021-03-15 DIAGNOSIS — Z79899 Other long term (current) drug therapy: Secondary | ICD-10-CM | POA: Diagnosis not present

## 2021-03-15 DIAGNOSIS — I110 Hypertensive heart disease with heart failure: Secondary | ICD-10-CM | POA: Diagnosis present

## 2021-03-15 DIAGNOSIS — G8929 Other chronic pain: Secondary | ICD-10-CM | POA: Diagnosis not present

## 2021-03-15 DIAGNOSIS — I5022 Chronic systolic (congestive) heart failure: Secondary | ICD-10-CM | POA: Diagnosis present

## 2021-03-15 DIAGNOSIS — I272 Pulmonary hypertension, unspecified: Secondary | ICD-10-CM | POA: Diagnosis not present

## 2021-03-15 DIAGNOSIS — M1990 Unspecified osteoarthritis, unspecified site: Secondary | ICD-10-CM | POA: Insufficient documentation

## 2021-03-15 DIAGNOSIS — F0390 Unspecified dementia without behavioral disturbance: Secondary | ICD-10-CM | POA: Diagnosis not present

## 2021-03-15 DIAGNOSIS — Z886 Allergy status to analgesic agent status: Secondary | ICD-10-CM | POA: Insufficient documentation

## 2021-03-15 DIAGNOSIS — I10 Essential (primary) hypertension: Secondary | ICD-10-CM

## 2021-03-15 DIAGNOSIS — I890 Lymphedema, not elsewhere classified: Secondary | ICD-10-CM

## 2021-03-15 DIAGNOSIS — B029 Zoster without complications: Secondary | ICD-10-CM | POA: Diagnosis not present

## 2021-03-15 LAB — BASIC METABOLIC PANEL
Anion gap: 9 (ref 5–15)
BUN: 19 mg/dL (ref 8–23)
CO2: 26 mmol/L (ref 22–32)
Calcium: 9.4 mg/dL (ref 8.9–10.3)
Chloride: 104 mmol/L (ref 98–111)
Creatinine, Ser: 0.89 mg/dL (ref 0.44–1.00)
GFR, Estimated: >60 mL/min (ref >60)
Glucose, Bld: 152 mg/dL — ABNORMAL HIGH (ref 70–99)
Potassium: 4 mmol/L (ref 3.5–5.1)
Sodium: 139 mmol/L (ref 135–145)

## 2021-03-15 NOTE — Patient Instructions (Addendum)
Start weighing daily and call for an overnight weight gain of > 2 pounds or a weekly weight gain of >5 pounds.

## 2021-03-18 ENCOUNTER — Telehealth: Payer: Self-pay | Admitting: Family

## 2021-03-18 MED ORDER — DAPAGLIFLOZIN PROPANEDIOL 10 MG PO TABS: 10.0000 mg | ORAL_TABLET | Freq: Every day | ORAL | 5 refills | Status: AC

## 2021-03-18 NOTE — Telephone Encounter (Signed)
LM on patient's voicemail about BMP results obtained 03/15/21. Kidney function is normal so will begin farxiga 10mg  daily. Will check lab work at next visit

## 2021-04-17 ENCOUNTER — Ambulatory Visit: Payer: Medicare Other | Admitting: Family

## 2021-05-08 NOTE — Progress Notes (Deleted)
Patient ID: Sheila Krueger, female    DOB: Jun 30, 1934, 86 y.o.   MRN: [id number]  HPI  Sheila Krueger is a 86 y/o female with a history of HTN, aneurysm, arthritis, dementia, lymphoma and chronic heart failure.   Echo report from 04/18/21 reviewed and showed an EF of 20-25% along with moderate PR/TR and moderate pulmonary HTN. Echo report from 03/04/21 reviewed and showed an EF of 30% along with mild MR and mild pulmonary HTN.   Admitted 04/17/21 due to acute on chronic HF. Initially given IV lasix with transition to oral diuretics. Elevated troponin thought to be due to demand ischemia. OT/PT evaluations done. Discharged after 6 days. Was in the ED 03/25/21 due to chest pain where she was evaluated and released. Admitted 03/03/21 due to acute on chronic HF. Given IV lasix with transition to oral diuretics. Discharged the following day. Was in the ED 02/26/21 due to shortness of breath. Given IV lasix with improvement of her symptoms and she was released.   She presents today for a follow-up visit with a chief complaint of   Past Medical History:  Diagnosis Date   Aneurysm ([place]) 2018   brain   Arthritis    CHF (congestive heart failure) ([place])    CNS lymphoma ([place])    Hypertension    No past surgical history on file. Family History  Problem Relation Age of Onset   Hypertension Sister    Social History   Tobacco Use   Smoking status: Never   Smokeless tobacco: Never  Substance Use Topics   Alcohol use: Not Currently   Allergies  Allergen Reactions   Aspirin Other (See Comments)    Tachycardia    Review of Systems  Constitutional:  Positive for appetite change (decreased) and fatigue.  HENT:  Negative for congestion, postnasal drip and sore throat.   Eyes: Negative.   Respiratory:  Positive for shortness of breath. Negative for cough.   Cardiovascular:  Positive for palpitations and leg swelling. Negative for chest pain.  Gastrointestinal:  Negative for abdominal distention  and abdominal pain.  Endocrine: Negative.   Genitourinary: Negative.   Musculoskeletal:  Positive for back pain and neck pain.  Skin:  Positive for rash.       Shingle rash mid chest and under left breast  Allergic/Immunologic: Negative.   Neurological:  Negative for dizziness and light-headedness.  Hematological:  Negative for adenopathy. Does not bruise/bleed easily.  Psychiatric/Behavioral:  Positive for sleep disturbance (sleeping in recliner for comfort). Negative for dysphoric mood. The patient is not nervous/anxious.       Physical Exam Vitals and nursing note reviewed.  Constitutional:      Appearance: She is well-developed.  HENT:     Head: Normocephalic and atraumatic.  Neck:     Vascular: No JVD.  Cardiovascular:     Rate and Rhythm: Normal rate and regular rhythm.  Pulmonary:     Effort: Pulmonary effort is normal.     Breath sounds: No rhonchi or rales.  Abdominal:     Palpations: Abdomen is soft.     Tenderness: There is no abdominal tenderness.  Musculoskeletal:     Cervical back: Neck supple.     Right lower leg: Edema (1+ pitting) present.     Left lower leg: No tenderness. Edema (1+ pitting) present.  Skin:    General: Skin is warm and dry.  Neurological:     General: No focal deficit present.     Mental Status: She  is alert and oriented to person, place, and time.  Psychiatric:        Mood and Affect: Mood normal.        Behavior: Behavior normal.     Assessment & Plan:  1: Chronic heart failure with reduced ejection fraction- - NYHA class II - euvolemic today - has scales but hasn't been weighing daily; instructed to start weighing daily, write the weight down and call for an overnight weight gain of > 2 pounds or a weekly weight gain of >5 pounds - weight 173.8 from last visit here 2 months ago - has been adding salt but says that she's adding less than she used to; reviewed the importance of not adding any salt to her food; low sodium cookbook  provided to the patient - she says that she used to even salt her hotdogs but says that she doesn't do this anymore - on GDMT of metoprolol - unsure of BP could tolerate entresto or MRA; consider adding SGLT2 but will get labs today first - BNP 03/04/21 was 402.43  2: HTN- - BP  - saw PCP Ouida Sills) 04/30/21 - BMP 04/22/21 reviewed and showed sodium 140, potassium 3.7, creatinine 0.73 and GFR 82  3: Lymphedema- - stage 2 - does elevate her legs when sitting for long periods of time - has worn compression socks in the past but admits that they are difficult to put on - limited in her ability to exercise due to her symptoms - consider compression boots if edema persists   Patient brought some of her medications today but didn't bring everything. Encouraged her to bring all her medications to every visit every time.

## 2021-05-09 ENCOUNTER — Telehealth: Payer: Self-pay | Admitting: Family

## 2021-05-09 ENCOUNTER — Ambulatory Visit: Payer: Medicare Other | Admitting: Family

## 2021-05-09 NOTE — Telephone Encounter (Signed)
LVM with patient in attempt to reschedule her no show appointment but was unable to reach.    Leland Raver, NT

## 2021-05-09 NOTE — Telephone Encounter (Signed)
Patient did not show for her Heart Failure Clinic appointment on 05/09/21. Will attempt to reschedule.

## 2021-07-27 DIAGNOSIS — Z6830 Body mass index (BMI) 30.0-30.9, adult: Secondary | ICD-10-CM | POA: Diagnosis not present

## 2021-07-27 DIAGNOSIS — I43 Cardiomyopathy in diseases classified elsewhere: Secondary | ICD-10-CM | POA: Diagnosis not present

## 2021-07-27 DIAGNOSIS — E854 Organ-limited amyloidosis: Secondary | ICD-10-CM | POA: Diagnosis not present

## 2021-07-27 DIAGNOSIS — I5043 Acute on chronic combined systolic (congestive) and diastolic (congestive) heart failure: Secondary | ICD-10-CM | POA: Diagnosis not present

## 2021-07-27 DIAGNOSIS — Z20822 Contact with and (suspected) exposure to covid-19: Secondary | ICD-10-CM | POA: Diagnosis not present

## 2021-07-27 DIAGNOSIS — I5082 Biventricular heart failure: Secondary | ICD-10-CM | POA: Diagnosis not present

## 2021-07-27 DIAGNOSIS — R0600 Dyspnea, unspecified: Secondary | ICD-10-CM | POA: Diagnosis not present

## 2021-07-27 DIAGNOSIS — R9389 Abnormal findings on diagnostic imaging of other specified body structures: Secondary | ICD-10-CM | POA: Diagnosis not present

## 2021-07-27 DIAGNOSIS — C8590 Non-Hodgkin lymphoma, unspecified, unspecified site: Secondary | ICD-10-CM | POA: Diagnosis not present

## 2021-07-27 DIAGNOSIS — G609 Hereditary and idiopathic neuropathy, unspecified: Secondary | ICD-10-CM | POA: Diagnosis not present

## 2021-07-27 DIAGNOSIS — Z7409 Other reduced mobility: Secondary | ICD-10-CM | POA: Diagnosis not present

## 2021-07-27 DIAGNOSIS — R0602 Shortness of breath: Secondary | ICD-10-CM | POA: Diagnosis not present

## 2021-07-27 DIAGNOSIS — F0390 Unspecified dementia without behavioral disturbance: Secondary | ICD-10-CM | POA: Diagnosis not present

## 2021-07-27 DIAGNOSIS — R9431 Abnormal electrocardiogram [ECG] [EKG]: Secondary | ICD-10-CM | POA: Diagnosis not present

## 2021-07-27 DIAGNOSIS — I110 Hypertensive heart disease with heart failure: Secondary | ICD-10-CM | POA: Diagnosis not present

## 2021-07-27 DIAGNOSIS — R638 Other symptoms and signs concerning food and fluid intake: Secondary | ICD-10-CM | POA: Diagnosis not present

## 2021-07-27 DIAGNOSIS — Z66 Do not resuscitate: Secondary | ICD-10-CM | POA: Diagnosis not present

## 2021-07-27 DIAGNOSIS — G301 Alzheimer's disease with late onset: Secondary | ICD-10-CM | POA: Diagnosis not present

## 2021-08-16 DIAGNOSIS — E8589 Other amyloidosis: Secondary | ICD-10-CM | POA: Diagnosis not present

## 2021-08-16 DIAGNOSIS — Z23 Encounter for immunization: Secondary | ICD-10-CM | POA: Diagnosis not present

## 2021-08-16 DIAGNOSIS — I110 Hypertensive heart disease with heart failure: Secondary | ICD-10-CM | POA: Diagnosis not present

## 2021-08-16 DIAGNOSIS — I779 Disorder of arteries and arterioles, unspecified: Secondary | ICD-10-CM | POA: Diagnosis not present

## 2021-08-16 DIAGNOSIS — R0609 Other forms of dyspnea: Secondary | ICD-10-CM | POA: Diagnosis not present

## 2021-08-16 DIAGNOSIS — E785 Hyperlipidemia, unspecified: Secondary | ICD-10-CM | POA: Diagnosis not present

## 2021-08-16 DIAGNOSIS — I43 Cardiomyopathy in diseases classified elsewhere: Secondary | ICD-10-CM | POA: Diagnosis not present

## 2021-08-16 DIAGNOSIS — I5022 Chronic systolic (congestive) heart failure: Secondary | ICD-10-CM | POA: Diagnosis not present

## 2021-08-16 DIAGNOSIS — Z79899 Other long term (current) drug therapy: Secondary | ICD-10-CM | POA: Diagnosis not present

## 2021-08-29 DIAGNOSIS — C8589 Other specified types of non-Hodgkin lymphoma, extranodal and solid organ sites: Secondary | ICD-10-CM | POA: Diagnosis not present

## 2021-08-29 DIAGNOSIS — F0280 Dementia in other diseases classified elsewhere without behavioral disturbance: Secondary | ICD-10-CM | POA: Diagnosis not present

## 2021-08-29 DIAGNOSIS — I5043 Acute on chronic combined systolic (congestive) and diastolic (congestive) heart failure: Secondary | ICD-10-CM | POA: Diagnosis not present

## 2021-08-29 DIAGNOSIS — G301 Alzheimer's disease with late onset: Secondary | ICD-10-CM | POA: Diagnosis not present

## 2021-08-29 DIAGNOSIS — I110 Hypertensive heart disease with heart failure: Secondary | ICD-10-CM | POA: Diagnosis not present

## 2021-08-29 DIAGNOSIS — R778 Other specified abnormalities of plasma proteins: Secondary | ICD-10-CM | POA: Diagnosis not present

## 2021-08-29 DIAGNOSIS — J449 Chronic obstructive pulmonary disease, unspecified: Secondary | ICD-10-CM | POA: Diagnosis not present

## 2021-09-11 DIAGNOSIS — M79641 Pain in right hand: Secondary | ICD-10-CM | POA: Diagnosis not present

## 2021-09-11 DIAGNOSIS — M79642 Pain in left hand: Secondary | ICD-10-CM | POA: Diagnosis not present

## 2021-09-11 DIAGNOSIS — E8589 Other amyloidosis: Secondary | ICD-10-CM | POA: Diagnosis not present

## 2021-09-11 DIAGNOSIS — I5022 Chronic systolic (congestive) heart failure: Secondary | ICD-10-CM | POA: Diagnosis not present

## 2021-10-01 DIAGNOSIS — I5022 Chronic systolic (congestive) heart failure: Secondary | ICD-10-CM | POA: Diagnosis not present

## 2021-10-01 DIAGNOSIS — I10 Essential (primary) hypertension: Secondary | ICD-10-CM | POA: Diagnosis not present

## 2021-10-27 DIAGNOSIS — R278 Other lack of coordination: Secondary | ICD-10-CM | POA: Diagnosis not present

## 2021-10-27 DIAGNOSIS — I21A1 Myocardial infarction type 2: Secondary | ICD-10-CM | POA: Diagnosis not present

## 2021-10-27 DIAGNOSIS — I5041 Acute combined systolic (congestive) and diastolic (congestive) heart failure: Secondary | ICD-10-CM | POA: Diagnosis not present

## 2021-10-27 DIAGNOSIS — I5023 Acute on chronic systolic (congestive) heart failure: Secondary | ICD-10-CM | POA: Diagnosis not present

## 2021-10-27 DIAGNOSIS — N261 Atrophy of kidney (terminal): Secondary | ICD-10-CM | POA: Diagnosis not present

## 2021-10-27 DIAGNOSIS — R9431 Abnormal electrocardiogram [ECG] [EKG]: Secondary | ICD-10-CM | POA: Diagnosis not present

## 2021-10-27 DIAGNOSIS — R600 Localized edema: Secondary | ICD-10-CM | POA: Diagnosis not present

## 2021-10-27 DIAGNOSIS — R531 Weakness: Secondary | ICD-10-CM | POA: Diagnosis not present

## 2021-10-27 DIAGNOSIS — I2510 Atherosclerotic heart disease of native coronary artery without angina pectoris: Secondary | ICD-10-CM | POA: Diagnosis not present

## 2021-10-27 DIAGNOSIS — I509 Heart failure, unspecified: Secondary | ICD-10-CM | POA: Diagnosis not present

## 2021-10-27 DIAGNOSIS — C8589 Other specified types of non-Hodgkin lymphoma, extranodal and solid organ sites: Secondary | ICD-10-CM | POA: Diagnosis not present

## 2021-10-27 DIAGNOSIS — I5042 Chronic combined systolic (congestive) and diastolic (congestive) heart failure: Secondary | ICD-10-CM | POA: Diagnosis not present

## 2021-10-27 DIAGNOSIS — E854 Organ-limited amyloidosis: Secondary | ICD-10-CM | POA: Diagnosis not present

## 2021-10-27 DIAGNOSIS — I50810 Right heart failure, unspecified: Secondary | ICD-10-CM | POA: Diagnosis not present

## 2021-10-27 DIAGNOSIS — R5381 Other malaise: Secondary | ICD-10-CM | POA: Diagnosis not present

## 2021-10-27 DIAGNOSIS — R2681 Unsteadiness on feet: Secondary | ICD-10-CM | POA: Diagnosis not present

## 2021-10-27 DIAGNOSIS — I5040 Unspecified combined systolic (congestive) and diastolic (congestive) heart failure: Secondary | ICD-10-CM | POA: Diagnosis not present

## 2021-10-27 DIAGNOSIS — I5043 Acute on chronic combined systolic (congestive) and diastolic (congestive) heart failure: Secondary | ICD-10-CM | POA: Diagnosis not present

## 2021-10-27 DIAGNOSIS — R059 Cough, unspecified: Secondary | ICD-10-CM | POA: Diagnosis not present

## 2021-10-27 DIAGNOSIS — R5383 Other fatigue: Secondary | ICD-10-CM | POA: Diagnosis not present

## 2021-10-27 DIAGNOSIS — R2243 Localized swelling, mass and lump, lower limb, bilateral: Secondary | ICD-10-CM | POA: Diagnosis not present

## 2021-10-27 DIAGNOSIS — N179 Acute kidney failure, unspecified: Secondary | ICD-10-CM | POA: Diagnosis not present

## 2021-10-27 DIAGNOSIS — I272 Pulmonary hypertension, unspecified: Secondary | ICD-10-CM | POA: Diagnosis not present

## 2021-10-27 DIAGNOSIS — I5082 Biventricular heart failure: Secondary | ICD-10-CM | POA: Diagnosis not present

## 2021-10-27 DIAGNOSIS — M6281 Muscle weakness (generalized): Secondary | ICD-10-CM | POA: Diagnosis not present

## 2021-10-27 DIAGNOSIS — R279 Unspecified lack of coordination: Secondary | ICD-10-CM | POA: Diagnosis not present

## 2021-10-27 DIAGNOSIS — I472 Ventricular tachycardia, unspecified: Secondary | ICD-10-CM | POA: Diagnosis not present

## 2021-10-27 DIAGNOSIS — Z6829 Body mass index (BMI) 29.0-29.9, adult: Secondary | ICD-10-CM | POA: Diagnosis not present

## 2021-10-27 DIAGNOSIS — R778 Other specified abnormalities of plasma proteins: Secondary | ICD-10-CM | POA: Diagnosis not present

## 2021-10-27 DIAGNOSIS — R918 Other nonspecific abnormal finding of lung field: Secondary | ICD-10-CM | POA: Diagnosis not present

## 2021-10-27 DIAGNOSIS — R9439 Abnormal result of other cardiovascular function study: Secondary | ICD-10-CM | POA: Diagnosis not present

## 2021-10-27 DIAGNOSIS — I5020 Unspecified systolic (congestive) heart failure: Secondary | ICD-10-CM | POA: Diagnosis not present

## 2021-10-27 DIAGNOSIS — Z7409 Other reduced mobility: Secondary | ICD-10-CM | POA: Diagnosis not present

## 2021-10-27 DIAGNOSIS — I43 Cardiomyopathy in diseases classified elsewhere: Secondary | ICD-10-CM | POA: Diagnosis not present

## 2021-10-28 DIAGNOSIS — I5040 Unspecified combined systolic (congestive) and diastolic (congestive) heart failure: Secondary | ICD-10-CM | POA: Diagnosis not present

## 2021-10-28 DIAGNOSIS — R9439 Abnormal result of other cardiovascular function study: Secondary | ICD-10-CM | POA: Diagnosis not present

## 2021-10-28 DIAGNOSIS — I509 Heart failure, unspecified: Secondary | ICD-10-CM | POA: Diagnosis not present

## 2021-10-28 DIAGNOSIS — N179 Acute kidney failure, unspecified: Secondary | ICD-10-CM | POA: Diagnosis not present

## 2021-10-28 DIAGNOSIS — R778 Other specified abnormalities of plasma proteins: Secondary | ICD-10-CM | POA: Diagnosis not present

## 2021-10-28 DIAGNOSIS — I5020 Unspecified systolic (congestive) heart failure: Secondary | ICD-10-CM | POA: Diagnosis not present

## 2021-10-28 DIAGNOSIS — N261 Atrophy of kidney (terminal): Secondary | ICD-10-CM | POA: Diagnosis not present

## 2021-10-28 DIAGNOSIS — I50810 Right heart failure, unspecified: Secondary | ICD-10-CM | POA: Diagnosis not present

## 2021-10-29 DIAGNOSIS — R600 Localized edema: Secondary | ICD-10-CM | POA: Diagnosis not present

## 2021-10-29 DIAGNOSIS — I5023 Acute on chronic systolic (congestive) heart failure: Secondary | ICD-10-CM | POA: Diagnosis not present

## 2021-10-29 DIAGNOSIS — N179 Acute kidney failure, unspecified: Secondary | ICD-10-CM | POA: Diagnosis not present

## 2021-10-29 DIAGNOSIS — I5040 Unspecified combined systolic (congestive) and diastolic (congestive) heart failure: Secondary | ICD-10-CM | POA: Diagnosis not present

## 2021-10-29 DIAGNOSIS — R778 Other specified abnormalities of plasma proteins: Secondary | ICD-10-CM | POA: Diagnosis not present

## 2021-10-29 IMAGING — CR DG CHEST 2V
1 series · 2 of 2 positions shown · non-contrast
Comparison: Chest CTA 02/11/2020 and earlier.

CLINICAL DATA: 84-year-old female positive [id number]. Worsening
symptoms. Low-grade fever.

EXAM:
CHEST - 2 VIEW

[Series 1: dg chest 2 view · 0.14mm/px · 2 of 2 slices shown]
[im 1/2]
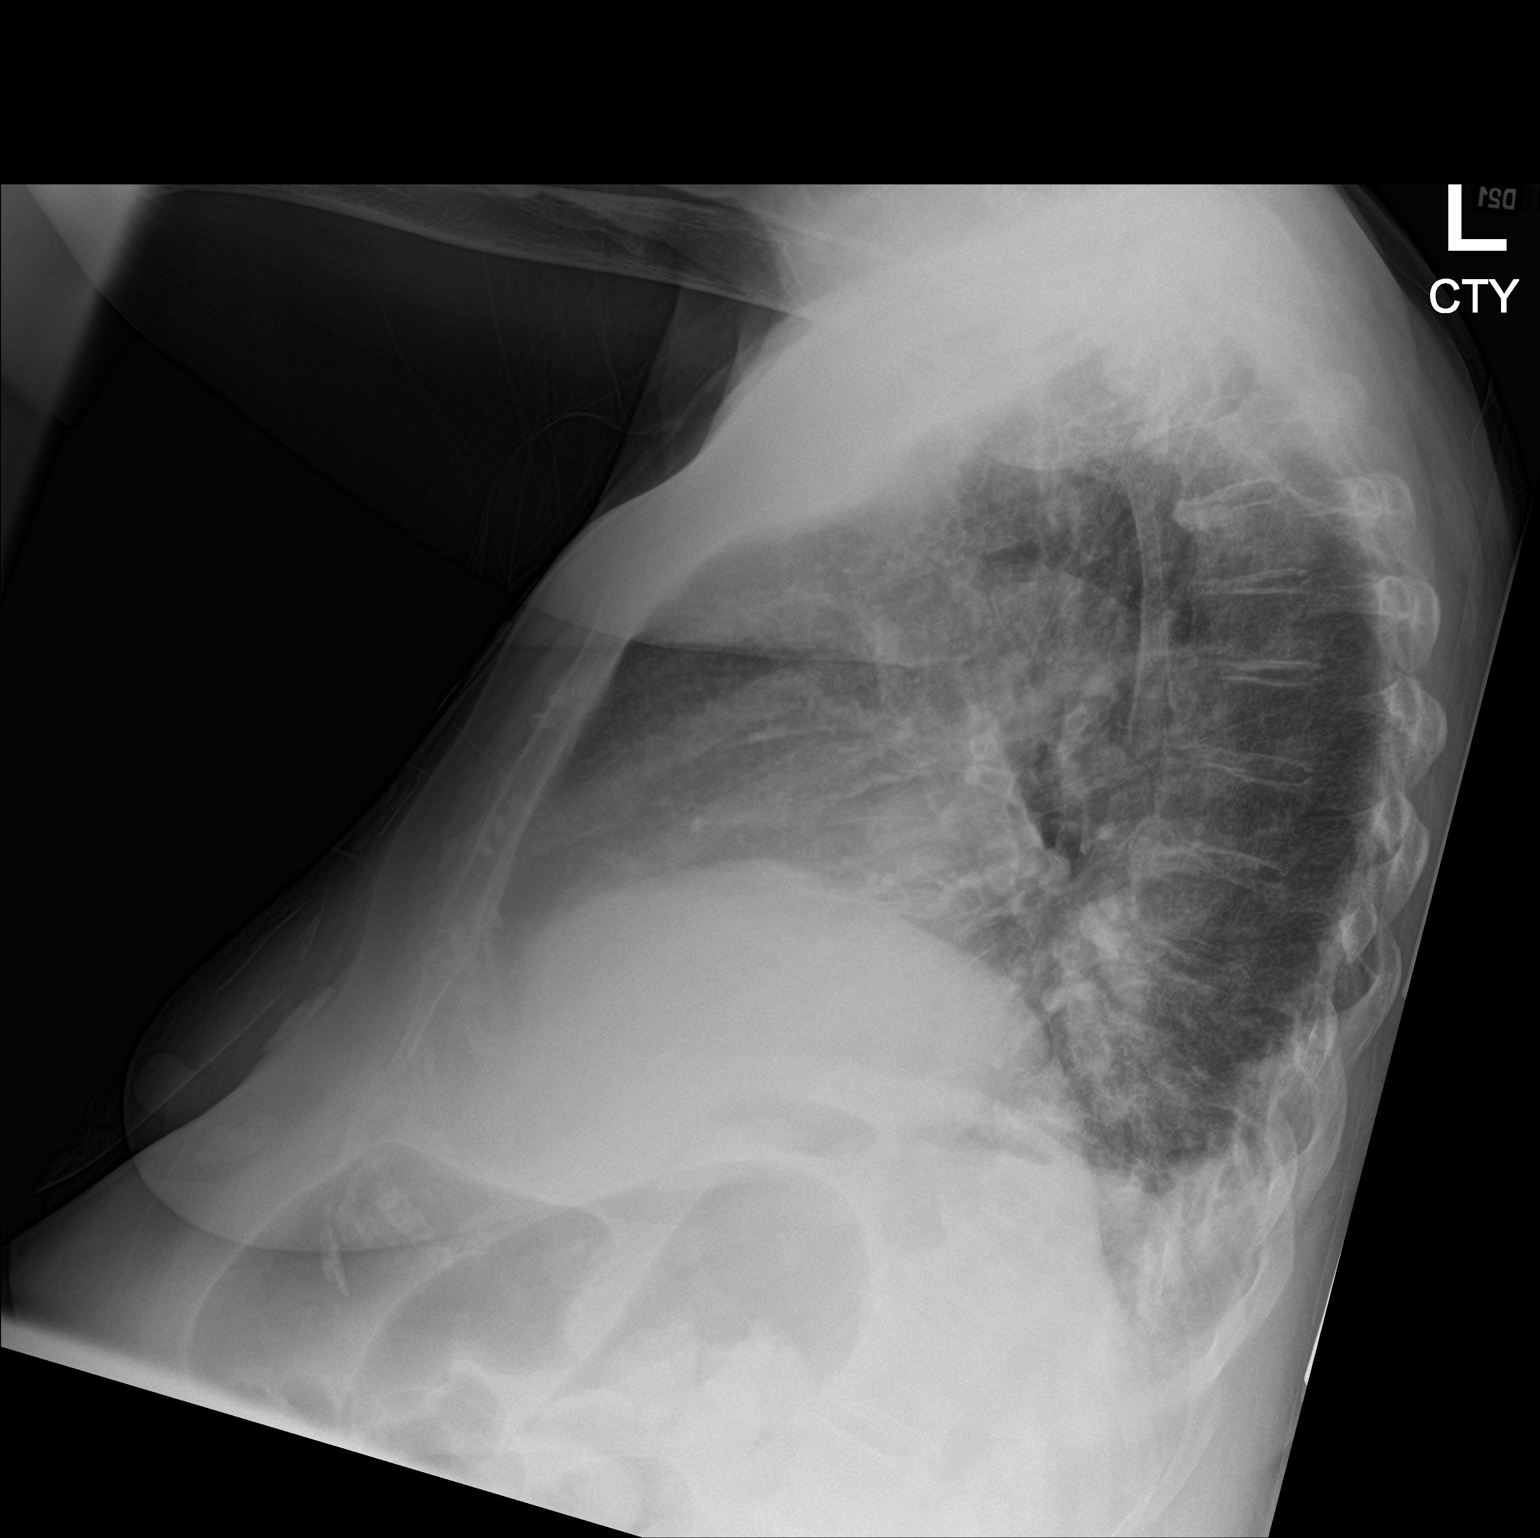
[im 2/2]
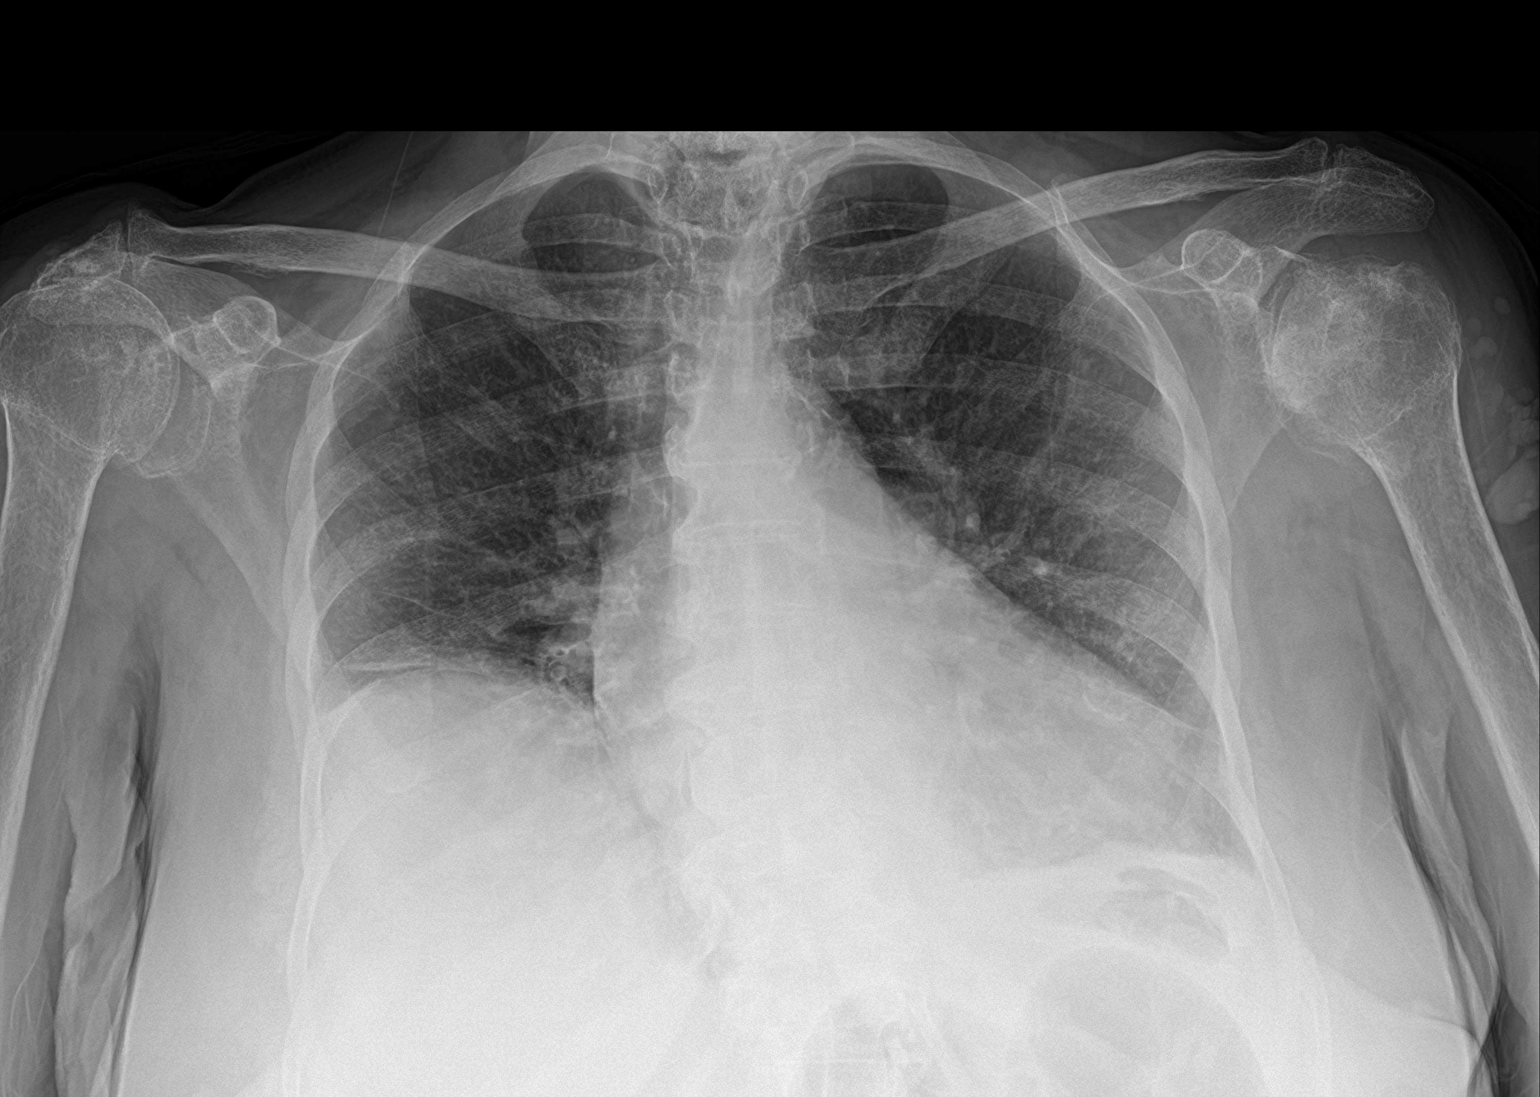

[2 of 2 positions shown; findings below may reference images not displayed]

FINDINGS: Stable cardiomegaly and elevation of the right hemidiaphragm. Stable
trachea. No pneumothorax, pulmonary edema, pleural effusion or
consolidation. Streaky right lung base opacity has not significantly
changed and atelectasis is favored as per the recent CTA. No acute
pulmonary opacity identified.

Stable visualized osseous structures. There are increased gas-filled
bowel loops in the upper abdomen. No pneumoperitoneum.
IMPRESSION: 1. No acute cardiopulmonary abnormality. Cardiomegaly and stable
lung base atelectasis.
2. Increased gas-filled bowel loops in the visible upper abdomen.
Recommend follow-up abdominal radiographs.

## 2021-10-30 DIAGNOSIS — R778 Other specified abnormalities of plasma proteins: Secondary | ICD-10-CM | POA: Diagnosis not present

## 2021-10-30 DIAGNOSIS — I5040 Unspecified combined systolic (congestive) and diastolic (congestive) heart failure: Secondary | ICD-10-CM | POA: Diagnosis not present

## 2021-10-30 DIAGNOSIS — I5023 Acute on chronic systolic (congestive) heart failure: Secondary | ICD-10-CM | POA: Diagnosis not present

## 2021-10-30 DIAGNOSIS — R9439 Abnormal result of other cardiovascular function study: Secondary | ICD-10-CM | POA: Diagnosis not present

## 2021-10-30 DIAGNOSIS — I5020 Unspecified systolic (congestive) heart failure: Secondary | ICD-10-CM | POA: Diagnosis not present

## 2021-10-30 DIAGNOSIS — R9431 Abnormal electrocardiogram [ECG] [EKG]: Secondary | ICD-10-CM | POA: Diagnosis not present

## 2021-10-30 DIAGNOSIS — I2510 Atherosclerotic heart disease of native coronary artery without angina pectoris: Secondary | ICD-10-CM | POA: Diagnosis not present

## 2021-10-30 DIAGNOSIS — N179 Acute kidney failure, unspecified: Secondary | ICD-10-CM | POA: Diagnosis not present

## 2021-10-31 DIAGNOSIS — R778 Other specified abnormalities of plasma proteins: Secondary | ICD-10-CM | POA: Diagnosis not present

## 2021-10-31 DIAGNOSIS — I2510 Atherosclerotic heart disease of native coronary artery without angina pectoris: Secondary | ICD-10-CM | POA: Diagnosis not present

## 2021-10-31 DIAGNOSIS — N179 Acute kidney failure, unspecified: Secondary | ICD-10-CM | POA: Diagnosis not present

## 2021-10-31 DIAGNOSIS — R2243 Localized swelling, mass and lump, lower limb, bilateral: Secondary | ICD-10-CM | POA: Diagnosis not present

## 2021-10-31 DIAGNOSIS — I5023 Acute on chronic systolic (congestive) heart failure: Secondary | ICD-10-CM | POA: Diagnosis not present

## 2021-11-01 DIAGNOSIS — R778 Other specified abnormalities of plasma proteins: Secondary | ICD-10-CM | POA: Diagnosis not present

## 2021-11-01 DIAGNOSIS — N179 Acute kidney failure, unspecified: Secondary | ICD-10-CM | POA: Diagnosis not present

## 2021-11-01 DIAGNOSIS — R2243 Localized swelling, mass and lump, lower limb, bilateral: Secondary | ICD-10-CM | POA: Diagnosis not present

## 2021-11-01 DIAGNOSIS — I5023 Acute on chronic systolic (congestive) heart failure: Secondary | ICD-10-CM | POA: Diagnosis not present

## 2021-11-02 DIAGNOSIS — R778 Other specified abnormalities of plasma proteins: Secondary | ICD-10-CM | POA: Diagnosis not present

## 2021-11-02 DIAGNOSIS — I5023 Acute on chronic systolic (congestive) heart failure: Secondary | ICD-10-CM | POA: Diagnosis not present

## 2021-11-02 DIAGNOSIS — R2243 Localized swelling, mass and lump, lower limb, bilateral: Secondary | ICD-10-CM | POA: Diagnosis not present

## 2021-11-02 DIAGNOSIS — N179 Acute kidney failure, unspecified: Secondary | ICD-10-CM | POA: Diagnosis not present

## 2021-11-03 DIAGNOSIS — R2243 Localized swelling, mass and lump, lower limb, bilateral: Secondary | ICD-10-CM | POA: Diagnosis not present

## 2021-11-03 DIAGNOSIS — R778 Other specified abnormalities of plasma proteins: Secondary | ICD-10-CM | POA: Diagnosis not present

## 2021-11-03 DIAGNOSIS — I5023 Acute on chronic systolic (congestive) heart failure: Secondary | ICD-10-CM | POA: Diagnosis not present

## 2021-11-03 DIAGNOSIS — N179 Acute kidney failure, unspecified: Secondary | ICD-10-CM | POA: Diagnosis not present

## 2021-11-04 DIAGNOSIS — R2243 Localized swelling, mass and lump, lower limb, bilateral: Secondary | ICD-10-CM | POA: Diagnosis not present

## 2021-11-04 DIAGNOSIS — I21A1 Myocardial infarction type 2: Secondary | ICD-10-CM | POA: Diagnosis not present

## 2021-11-04 DIAGNOSIS — I5023 Acute on chronic systolic (congestive) heart failure: Secondary | ICD-10-CM | POA: Diagnosis not present

## 2021-11-04 DIAGNOSIS — N179 Acute kidney failure, unspecified: Secondary | ICD-10-CM | POA: Diagnosis not present

## 2021-11-05 DIAGNOSIS — N179 Acute kidney failure, unspecified: Secondary | ICD-10-CM | POA: Diagnosis not present

## 2021-11-05 DIAGNOSIS — R278 Other lack of coordination: Secondary | ICD-10-CM | POA: Diagnosis not present

## 2021-11-05 DIAGNOSIS — E119 Type 2 diabetes mellitus without complications: Secondary | ICD-10-CM | POA: Diagnosis not present

## 2021-11-05 DIAGNOSIS — I21A1 Myocardial infarction type 2: Secondary | ICD-10-CM | POA: Diagnosis not present

## 2021-11-05 DIAGNOSIS — I2510 Atherosclerotic heart disease of native coronary artery without angina pectoris: Secondary | ICD-10-CM | POA: Diagnosis not present

## 2021-11-05 DIAGNOSIS — M6281 Muscle weakness (generalized): Secondary | ICD-10-CM | POA: Diagnosis not present

## 2021-11-05 DIAGNOSIS — R279 Unspecified lack of coordination: Secondary | ICD-10-CM | POA: Diagnosis not present

## 2021-11-05 DIAGNOSIS — I110 Hypertensive heart disease with heart failure: Secondary | ICD-10-CM | POA: Diagnosis not present

## 2021-11-05 DIAGNOSIS — I214 Non-ST elevation (NSTEMI) myocardial infarction: Secondary | ICD-10-CM | POA: Diagnosis not present

## 2021-11-05 DIAGNOSIS — I259 Chronic ischemic heart disease, unspecified: Secondary | ICD-10-CM | POA: Diagnosis not present

## 2021-11-05 DIAGNOSIS — R2243 Localized swelling, mass and lump, lower limb, bilateral: Secondary | ICD-10-CM | POA: Diagnosis not present

## 2021-11-05 DIAGNOSIS — I5022 Chronic systolic (congestive) heart failure: Secondary | ICD-10-CM | POA: Diagnosis not present

## 2021-11-05 DIAGNOSIS — I5041 Acute combined systolic (congestive) and diastolic (congestive) heart failure: Secondary | ICD-10-CM | POA: Diagnosis not present

## 2021-11-05 DIAGNOSIS — I10 Essential (primary) hypertension: Secondary | ICD-10-CM | POA: Diagnosis not present

## 2021-11-05 DIAGNOSIS — F0390 Unspecified dementia without behavioral disturbance: Secondary | ICD-10-CM | POA: Diagnosis not present

## 2021-11-05 DIAGNOSIS — R778 Other specified abnormalities of plasma proteins: Secondary | ICD-10-CM | POA: Diagnosis not present

## 2021-11-05 DIAGNOSIS — R2681 Unsteadiness on feet: Secondary | ICD-10-CM | POA: Diagnosis not present

## 2021-11-05 DIAGNOSIS — N178 Other acute kidney failure: Secondary | ICD-10-CM | POA: Diagnosis not present

## 2021-11-05 DIAGNOSIS — R5381 Other malaise: Secondary | ICD-10-CM | POA: Diagnosis not present

## 2021-11-05 DIAGNOSIS — I5023 Acute on chronic systolic (congestive) heart failure: Secondary | ICD-10-CM | POA: Diagnosis not present

## 2021-11-07 DIAGNOSIS — I214 Non-ST elevation (NSTEMI) myocardial infarction: Secondary | ICD-10-CM | POA: Diagnosis not present

## 2021-11-07 DIAGNOSIS — E119 Type 2 diabetes mellitus without complications: Secondary | ICD-10-CM | POA: Diagnosis not present

## 2021-11-07 DIAGNOSIS — I2510 Atherosclerotic heart disease of native coronary artery without angina pectoris: Secondary | ICD-10-CM | POA: Diagnosis not present

## 2021-11-07 DIAGNOSIS — I10 Essential (primary) hypertension: Secondary | ICD-10-CM | POA: Diagnosis not present

## 2021-11-07 DIAGNOSIS — M6281 Muscle weakness (generalized): Secondary | ICD-10-CM | POA: Diagnosis not present

## 2021-11-07 DIAGNOSIS — I5022 Chronic systolic (congestive) heart failure: Secondary | ICD-10-CM | POA: Diagnosis not present

## 2021-11-07 DIAGNOSIS — I110 Hypertensive heart disease with heart failure: Secondary | ICD-10-CM | POA: Diagnosis not present

## 2021-11-11 DIAGNOSIS — N179 Acute kidney failure, unspecified: Secondary | ICD-10-CM | POA: Diagnosis not present

## 2021-11-11 DIAGNOSIS — I214 Non-ST elevation (NSTEMI) myocardial infarction: Secondary | ICD-10-CM | POA: Diagnosis not present

## 2021-11-11 DIAGNOSIS — I2510 Atherosclerotic heart disease of native coronary artery without angina pectoris: Secondary | ICD-10-CM | POA: Diagnosis not present

## 2021-11-11 DIAGNOSIS — E119 Type 2 diabetes mellitus without complications: Secondary | ICD-10-CM | POA: Diagnosis not present

## 2021-11-11 DIAGNOSIS — I10 Essential (primary) hypertension: Secondary | ICD-10-CM | POA: Diagnosis not present

## 2021-11-11 DIAGNOSIS — I5022 Chronic systolic (congestive) heart failure: Secondary | ICD-10-CM | POA: Diagnosis not present

## 2021-11-11 DIAGNOSIS — I259 Chronic ischemic heart disease, unspecified: Secondary | ICD-10-CM | POA: Diagnosis not present

## 2021-11-11 DIAGNOSIS — F0390 Unspecified dementia without behavioral disturbance: Secondary | ICD-10-CM | POA: Diagnosis not present

## 2021-11-11 DIAGNOSIS — M6281 Muscle weakness (generalized): Secondary | ICD-10-CM | POA: Diagnosis not present

## 2021-11-11 DIAGNOSIS — I21A1 Myocardial infarction type 2: Secondary | ICD-10-CM | POA: Diagnosis not present

## 2021-11-13 DIAGNOSIS — I2510 Atherosclerotic heart disease of native coronary artery without angina pectoris: Secondary | ICD-10-CM | POA: Diagnosis not present

## 2021-11-13 DIAGNOSIS — I5022 Chronic systolic (congestive) heart failure: Secondary | ICD-10-CM | POA: Diagnosis not present

## 2021-11-13 DIAGNOSIS — M6281 Muscle weakness (generalized): Secondary | ICD-10-CM | POA: Diagnosis not present

## 2021-11-13 DIAGNOSIS — I10 Essential (primary) hypertension: Secondary | ICD-10-CM | POA: Diagnosis not present

## 2021-11-13 DIAGNOSIS — I214 Non-ST elevation (NSTEMI) myocardial infarction: Secondary | ICD-10-CM | POA: Diagnosis not present

## 2021-11-13 DIAGNOSIS — E119 Type 2 diabetes mellitus without complications: Secondary | ICD-10-CM | POA: Diagnosis not present

## 2021-11-14 DIAGNOSIS — I110 Hypertensive heart disease with heart failure: Secondary | ICD-10-CM | POA: Diagnosis not present

## 2021-11-14 DIAGNOSIS — E119 Type 2 diabetes mellitus without complications: Secondary | ICD-10-CM | POA: Diagnosis not present

## 2021-11-14 DIAGNOSIS — I10 Essential (primary) hypertension: Secondary | ICD-10-CM | POA: Diagnosis not present

## 2021-11-14 DIAGNOSIS — I5022 Chronic systolic (congestive) heart failure: Secondary | ICD-10-CM | POA: Diagnosis not present

## 2021-11-14 DIAGNOSIS — N178 Other acute kidney failure: Secondary | ICD-10-CM | POA: Diagnosis not present

## 2021-11-14 DIAGNOSIS — I2510 Atherosclerotic heart disease of native coronary artery without angina pectoris: Secondary | ICD-10-CM | POA: Diagnosis not present

## 2021-11-14 DIAGNOSIS — M6281 Muscle weakness (generalized): Secondary | ICD-10-CM | POA: Diagnosis not present

## 2021-11-14 DIAGNOSIS — I214 Non-ST elevation (NSTEMI) myocardial infarction: Secondary | ICD-10-CM | POA: Diagnosis not present

## 2021-11-15 DIAGNOSIS — I214 Non-ST elevation (NSTEMI) myocardial infarction: Secondary | ICD-10-CM | POA: Diagnosis not present

## 2021-11-15 DIAGNOSIS — M6281 Muscle weakness (generalized): Secondary | ICD-10-CM | POA: Diagnosis not present

## 2021-11-15 DIAGNOSIS — I5022 Chronic systolic (congestive) heart failure: Secondary | ICD-10-CM | POA: Diagnosis not present

## 2021-11-15 DIAGNOSIS — E119 Type 2 diabetes mellitus without complications: Secondary | ICD-10-CM | POA: Diagnosis not present

## 2021-11-15 DIAGNOSIS — I10 Essential (primary) hypertension: Secondary | ICD-10-CM | POA: Diagnosis not present

## 2021-11-15 DIAGNOSIS — I2510 Atherosclerotic heart disease of native coronary artery without angina pectoris: Secondary | ICD-10-CM | POA: Diagnosis not present

## 2021-11-18 DIAGNOSIS — I110 Hypertensive heart disease with heart failure: Secondary | ICD-10-CM | POA: Diagnosis not present

## 2021-11-18 DIAGNOSIS — I10 Essential (primary) hypertension: Secondary | ICD-10-CM | POA: Diagnosis not present

## 2021-11-18 DIAGNOSIS — M6281 Muscle weakness (generalized): Secondary | ICD-10-CM | POA: Diagnosis not present

## 2021-11-18 DIAGNOSIS — E119 Type 2 diabetes mellitus without complications: Secondary | ICD-10-CM | POA: Diagnosis not present

## 2021-11-18 DIAGNOSIS — I2510 Atherosclerotic heart disease of native coronary artery without angina pectoris: Secondary | ICD-10-CM | POA: Diagnosis not present

## 2021-11-18 DIAGNOSIS — I214 Non-ST elevation (NSTEMI) myocardial infarction: Secondary | ICD-10-CM | POA: Diagnosis not present

## 2021-11-18 DIAGNOSIS — I5022 Chronic systolic (congestive) heart failure: Secondary | ICD-10-CM | POA: Diagnosis not present

## 2021-11-22 DIAGNOSIS — I214 Non-ST elevation (NSTEMI) myocardial infarction: Secondary | ICD-10-CM | POA: Diagnosis not present

## 2021-11-22 DIAGNOSIS — E119 Type 2 diabetes mellitus without complications: Secondary | ICD-10-CM | POA: Diagnosis not present

## 2021-11-22 DIAGNOSIS — I2510 Atherosclerotic heart disease of native coronary artery without angina pectoris: Secondary | ICD-10-CM | POA: Diagnosis not present

## 2021-11-22 DIAGNOSIS — M6281 Muscle weakness (generalized): Secondary | ICD-10-CM | POA: Diagnosis not present

## 2021-11-22 DIAGNOSIS — I5022 Chronic systolic (congestive) heart failure: Secondary | ICD-10-CM | POA: Diagnosis not present

## 2021-11-22 DIAGNOSIS — I10 Essential (primary) hypertension: Secondary | ICD-10-CM | POA: Diagnosis not present

## 2021-11-25 DIAGNOSIS — I214 Non-ST elevation (NSTEMI) myocardial infarction: Secondary | ICD-10-CM | POA: Diagnosis not present

## 2021-11-25 DIAGNOSIS — I2510 Atherosclerotic heart disease of native coronary artery without angina pectoris: Secondary | ICD-10-CM | POA: Diagnosis not present

## 2021-11-25 DIAGNOSIS — I10 Essential (primary) hypertension: Secondary | ICD-10-CM | POA: Diagnosis not present

## 2021-11-25 DIAGNOSIS — M6281 Muscle weakness (generalized): Secondary | ICD-10-CM | POA: Diagnosis not present

## 2021-11-25 DIAGNOSIS — I5022 Chronic systolic (congestive) heart failure: Secondary | ICD-10-CM | POA: Diagnosis not present

## 2021-11-25 DIAGNOSIS — E119 Type 2 diabetes mellitus without complications: Secondary | ICD-10-CM | POA: Diagnosis not present

## 2021-11-28 DIAGNOSIS — I5022 Chronic systolic (congestive) heart failure: Secondary | ICD-10-CM | POA: Diagnosis not present

## 2021-12-05 DIAGNOSIS — I10 Essential (primary) hypertension: Secondary | ICD-10-CM | POA: Diagnosis not present

## 2021-12-05 DIAGNOSIS — I5022 Chronic systolic (congestive) heart failure: Secondary | ICD-10-CM | POA: Diagnosis not present

## 2021-12-24 DIAGNOSIS — I10 Essential (primary) hypertension: Secondary | ICD-10-CM | POA: Diagnosis not present

## 2021-12-24 DIAGNOSIS — I5022 Chronic systolic (congestive) heart failure: Secondary | ICD-10-CM | POA: Diagnosis not present

## 2022-01-28 DIAGNOSIS — E785 Hyperlipidemia, unspecified: Secondary | ICD-10-CM | POA: Diagnosis not present

## 2022-01-28 DIAGNOSIS — Z0000 Encounter for general adult medical examination without abnormal findings: Secondary | ICD-10-CM | POA: Diagnosis not present

## 2022-01-28 DIAGNOSIS — C8589 Other specified types of non-Hodgkin lymphoma, extranodal and solid organ sites: Secondary | ICD-10-CM | POA: Diagnosis not present

## 2022-01-28 DIAGNOSIS — I779 Disorder of arteries and arterioles, unspecified: Secondary | ICD-10-CM | POA: Diagnosis not present

## 2022-01-28 DIAGNOSIS — Z87891 Personal history of nicotine dependence: Secondary | ICD-10-CM | POA: Diagnosis not present

## 2022-01-28 DIAGNOSIS — R7303 Prediabetes: Secondary | ICD-10-CM | POA: Diagnosis not present

## 2022-01-28 DIAGNOSIS — I5022 Chronic systolic (congestive) heart failure: Secondary | ICD-10-CM | POA: Diagnosis not present

## 2022-01-28 DIAGNOSIS — I110 Hypertensive heart disease with heart failure: Secondary | ICD-10-CM | POA: Diagnosis not present

## 2022-03-12 DIAGNOSIS — I10 Essential (primary) hypertension: Secondary | ICD-10-CM | POA: Diagnosis not present

## 2022-03-12 DIAGNOSIS — I5022 Chronic systolic (congestive) heart failure: Secondary | ICD-10-CM | POA: Diagnosis not present

## 2022-04-26 DIAGNOSIS — R52 Pain, unspecified: Secondary | ICD-10-CM | POA: Diagnosis not present

## 2022-04-26 DIAGNOSIS — K5909 Other constipation: Secondary | ICD-10-CM | POA: Diagnosis not present

## 2022-04-26 DIAGNOSIS — Z792 Long term (current) use of antibiotics: Secondary | ICD-10-CM | POA: Diagnosis not present

## 2022-04-26 DIAGNOSIS — R600 Localized edema: Secondary | ICD-10-CM | POA: Diagnosis not present

## 2022-04-26 DIAGNOSIS — I509 Heart failure, unspecified: Secondary | ICD-10-CM | POA: Diagnosis not present

## 2022-04-26 DIAGNOSIS — R531 Weakness: Secondary | ICD-10-CM | POA: Diagnosis not present

## 2022-04-26 DIAGNOSIS — R5383 Other fatigue: Secondary | ICD-10-CM | POA: Diagnosis not present

## 2022-04-26 DIAGNOSIS — E785 Hyperlipidemia, unspecified: Secondary | ICD-10-CM | POA: Diagnosis not present

## 2022-04-26 DIAGNOSIS — R5381 Other malaise: Secondary | ICD-10-CM | POA: Diagnosis not present

## 2022-04-26 DIAGNOSIS — I2510 Atherosclerotic heart disease of native coronary artery without angina pectoris: Secondary | ICD-10-CM | POA: Diagnosis not present

## 2022-04-26 DIAGNOSIS — R9389 Abnormal findings on diagnostic imaging of other specified body structures: Secondary | ICD-10-CM | POA: Diagnosis not present

## 2022-04-26 DIAGNOSIS — G629 Polyneuropathy, unspecified: Secondary | ICD-10-CM | POA: Diagnosis not present

## 2022-04-26 DIAGNOSIS — I272 Pulmonary hypertension, unspecified: Secondary | ICD-10-CM | POA: Diagnosis not present

## 2022-04-26 DIAGNOSIS — Z20822 Contact with and (suspected) exposure to covid-19: Secondary | ICD-10-CM | POA: Diagnosis not present

## 2022-04-26 DIAGNOSIS — R0600 Dyspnea, unspecified: Secondary | ICD-10-CM | POA: Diagnosis not present

## 2022-04-26 DIAGNOSIS — Z7982 Long term (current) use of aspirin: Secondary | ICD-10-CM | POA: Diagnosis not present

## 2022-04-26 DIAGNOSIS — I110 Hypertensive heart disease with heart failure: Secondary | ICD-10-CM | POA: Diagnosis not present

## 2022-04-26 DIAGNOSIS — I5020 Unspecified systolic (congestive) heart failure: Secondary | ICD-10-CM | POA: Diagnosis not present

## 2022-04-26 DIAGNOSIS — Z66 Do not resuscitate: Secondary | ICD-10-CM | POA: Diagnosis not present

## 2022-04-26 DIAGNOSIS — R0602 Shortness of breath: Secondary | ICD-10-CM | POA: Diagnosis not present

## 2022-04-26 DIAGNOSIS — M792 Neuralgia and neuritis, unspecified: Secondary | ICD-10-CM | POA: Diagnosis not present

## 2022-04-26 DIAGNOSIS — I5023 Acute on chronic systolic (congestive) heart failure: Secondary | ICD-10-CM | POA: Diagnosis not present

## 2022-04-26 DIAGNOSIS — Z79899 Other long term (current) drug therapy: Secondary | ICD-10-CM | POA: Diagnosis not present

## 2022-04-26 DIAGNOSIS — R918 Other nonspecific abnormal finding of lung field: Secondary | ICD-10-CM | POA: Diagnosis not present

## 2022-04-26 DIAGNOSIS — F0390 Unspecified dementia without behavioral disturbance: Secondary | ICD-10-CM | POA: Diagnosis not present

## 2022-05-05 DIAGNOSIS — R5381 Other malaise: Secondary | ICD-10-CM | POA: Diagnosis not present

## 2022-05-12 DIAGNOSIS — E785 Hyperlipidemia, unspecified: Secondary | ICD-10-CM | POA: Diagnosis not present

## 2022-05-12 DIAGNOSIS — I272 Pulmonary hypertension, unspecified: Secondary | ICD-10-CM | POA: Diagnosis not present

## 2022-05-12 DIAGNOSIS — I5042 Chronic combined systolic (congestive) and diastolic (congestive) heart failure: Secondary | ICD-10-CM | POA: Diagnosis not present

## 2022-05-12 DIAGNOSIS — I110 Hypertensive heart disease with heart failure: Secondary | ICD-10-CM | POA: Diagnosis not present

## 2022-05-12 DIAGNOSIS — I2510 Atherosclerotic heart disease of native coronary artery without angina pectoris: Secondary | ICD-10-CM | POA: Diagnosis not present

## 2022-05-12 DIAGNOSIS — E119 Type 2 diabetes mellitus without complications: Secondary | ICD-10-CM | POA: Diagnosis not present

## 2022-05-12 DIAGNOSIS — F0390 Unspecified dementia without behavioral disturbance: Secondary | ICD-10-CM | POA: Diagnosis not present

## 2022-06-09 ENCOUNTER — Encounter: Payer: Self-pay | Admitting: Emergency Medicine

## 2022-06-09 ENCOUNTER — Emergency Department
Admission: EM | Admit: 2022-06-09 | Discharge: 2022-06-09 | Discharge status: Against Medical Advice | Payer: Medicare HMO | Attending: Emergency Medicine | Admitting: Emergency Medicine

## 2022-06-09 DIAGNOSIS — Z1152 Encounter for screening for COVID-19: Secondary | ICD-10-CM | POA: Diagnosis not present

## 2022-06-09 DIAGNOSIS — Z5321 Procedure and treatment not carried out due to patient leaving prior to being seen by health care provider: Secondary | ICD-10-CM | POA: Insufficient documentation

## 2022-06-09 DIAGNOSIS — N939 Abnormal uterine and vaginal bleeding, unspecified: Secondary | ICD-10-CM | POA: Diagnosis not present

## 2022-06-09 DIAGNOSIS — K921 Melena: Secondary | ICD-10-CM | POA: Diagnosis not present

## 2022-06-09 DIAGNOSIS — Z8572 Personal history of non-Hodgkin lymphomas: Secondary | ICD-10-CM | POA: Diagnosis not present

## 2022-06-09 DIAGNOSIS — K625 Hemorrhage of anus and rectum: Secondary | ICD-10-CM | POA: Diagnosis not present

## 2022-06-09 DIAGNOSIS — I5022 Chronic systolic (congestive) heart failure: Secondary | ICD-10-CM | POA: Diagnosis not present

## 2022-06-09 DIAGNOSIS — F0280 Dementia in other diseases classified elsewhere without behavioral disturbance: Secondary | ICD-10-CM | POA: Diagnosis not present

## 2022-06-09 DIAGNOSIS — Z20822 Contact with and (suspected) exposure to covid-19: Secondary | ICD-10-CM | POA: Diagnosis not present

## 2022-06-09 DIAGNOSIS — I272 Pulmonary hypertension, unspecified: Secondary | ICD-10-CM | POA: Diagnosis not present

## 2022-06-09 DIAGNOSIS — K648 Other hemorrhoids: Secondary | ICD-10-CM | POA: Diagnosis not present

## 2022-06-09 DIAGNOSIS — R58 Hemorrhage, not elsewhere classified: Secondary | ICD-10-CM | POA: Diagnosis not present

## 2022-06-09 DIAGNOSIS — G309 Alzheimer's disease, unspecified: Secondary | ICD-10-CM | POA: Diagnosis not present

## 2022-06-09 DIAGNOSIS — I2510 Atherosclerotic heart disease of native coronary artery without angina pectoris: Secondary | ICD-10-CM | POA: Diagnosis not present

## 2022-06-09 LAB — COMPREHENSIVE METABOLIC PANEL
ALT: 20 U/L (ref 0–44)
AST: 29 U/L (ref 15–41)
Albumin: 4.1 g/dL (ref 3.5–5.0)
Alkaline Phosphatase: 82 U/L (ref 38–126)
Anion gap: 10 (ref 5–15)
BUN: 24 mg/dL — ABNORMAL HIGH (ref 8–23)
CO2: 28 mmol/L (ref 22–32)
Calcium: 9.5 mg/dL (ref 8.9–10.3)
Chloride: 103 mmol/L (ref 98–111)
Creatinine, Ser: 0.91 mg/dL (ref 0.44–1.00)
GFR, Estimated: >60 mL/min (ref >60)
Glucose, Bld: 107 mg/dL — ABNORMAL HIGH (ref 70–99)
Potassium: 4.7 mmol/L (ref 3.5–5.1)
Sodium: 141 mmol/L (ref 135–145)
Total Bilirubin: 0.6 mg/dL (ref 0.3–1.2)
Total Protein: 7.3 g/dL (ref 6.5–8.1)

## 2022-06-09 LAB — CBC
HCT: 39.1 % (ref 36.0–46.0)
Hemoglobin: 11.7 g/dL — ABNORMAL LOW (ref 12.0–15.0)
MCH: 27 pg (ref 26.0–34.0)
MCHC: 29.9 g/dL — ABNORMAL LOW (ref 30.0–36.0)
MCV: 90.3 fL (ref 80.0–100.0)
Platelets: 173 10*3/uL (ref 150–400)
RBC: 4.33 MIL/uL (ref 3.87–5.11)
RDW: 15.1 % (ref 11.5–15.5)
WBC: 5.9 10*3/uL (ref 4.0–10.5)
nRBC: 0 % (ref 0.0–0.2)

## 2022-06-09 LAB — TYPE AND SCREEN
ABO/RH(D): B POS
Antibody Screen: NEGATIVE

## 2022-06-09 NOTE — ED Triage Notes (Signed)
Pt in via EMS from home with c/o rectal bleeding. Pt has also not taken her cardiac meds for 3 days because she has been out. 150/92

## 2022-06-09 NOTE — ED Triage Notes (Signed)
Pt arrived via [facility] from home with c/o rectal bleeding. Pt sts she began to pass dark clots with bright red blood from her rectum this evening since 1730. Pt denies taking blood thinners. Pt reports she had episode like this in past approx 15 years prior.

## 2022-06-10 DIAGNOSIS — K921 Melena: Secondary | ICD-10-CM | POA: Diagnosis not present

## 2022-06-12 DIAGNOSIS — K5900 Constipation, unspecified: Secondary | ICD-10-CM | POA: Diagnosis not present

## 2022-06-12 DIAGNOSIS — G309 Alzheimer's disease, unspecified: Secondary | ICD-10-CM | POA: Diagnosis not present

## 2022-06-12 DIAGNOSIS — Z6832 Body mass index (BMI) 32.0-32.9, adult: Secondary | ICD-10-CM | POA: Diagnosis not present

## 2022-06-12 DIAGNOSIS — H9193 Unspecified hearing loss, bilateral: Secondary | ICD-10-CM | POA: Diagnosis not present

## 2022-06-12 DIAGNOSIS — I739 Peripheral vascular disease, unspecified: Secondary | ICD-10-CM | POA: Diagnosis not present

## 2022-06-12 DIAGNOSIS — K219 Gastro-esophageal reflux disease without esophagitis: Secondary | ICD-10-CM | POA: Diagnosis not present

## 2022-06-12 DIAGNOSIS — E785 Hyperlipidemia, unspecified: Secondary | ICD-10-CM | POA: Diagnosis not present

## 2022-06-12 DIAGNOSIS — I272 Pulmonary hypertension, unspecified: Secondary | ICD-10-CM | POA: Diagnosis not present

## 2022-06-12 DIAGNOSIS — B0229 Other postherpetic nervous system involvement: Secondary | ICD-10-CM | POA: Diagnosis not present

## 2022-06-12 DIAGNOSIS — E669 Obesity, unspecified: Secondary | ICD-10-CM | POA: Diagnosis not present

## 2022-06-12 DIAGNOSIS — Z809 Family history of malignant neoplasm, unspecified: Secondary | ICD-10-CM | POA: Diagnosis not present

## 2022-06-12 DIAGNOSIS — I700 Atherosclerosis of aorta: Secondary | ICD-10-CM | POA: Diagnosis not present

## 2022-06-12 DIAGNOSIS — Z8249 Family history of ischemic heart disease and other diseases of the circulatory system: Secondary | ICD-10-CM | POA: Diagnosis not present

## 2022-06-12 DIAGNOSIS — J302 Other seasonal allergic rhinitis: Secondary | ICD-10-CM | POA: Diagnosis not present

## 2022-06-12 DIAGNOSIS — E876 Hypokalemia: Secondary | ICD-10-CM | POA: Diagnosis not present

## 2022-06-12 DIAGNOSIS — I429 Cardiomyopathy, unspecified: Secondary | ICD-10-CM | POA: Diagnosis not present

## 2022-06-12 DIAGNOSIS — I2510 Atherosclerotic heart disease of native coronary artery without angina pectoris: Secondary | ICD-10-CM | POA: Diagnosis not present

## 2022-06-12 DIAGNOSIS — M1990 Unspecified osteoarthritis, unspecified site: Secondary | ICD-10-CM | POA: Diagnosis not present

## 2022-06-12 DIAGNOSIS — R32 Unspecified urinary incontinence: Secondary | ICD-10-CM | POA: Diagnosis not present

## 2022-06-16 DIAGNOSIS — K625 Hemorrhage of anus and rectum: Secondary | ICD-10-CM | POA: Diagnosis not present

## 2022-06-16 DIAGNOSIS — R7303 Prediabetes: Secondary | ICD-10-CM | POA: Diagnosis not present

## 2022-06-16 DIAGNOSIS — I5022 Chronic systolic (congestive) heart failure: Secondary | ICD-10-CM | POA: Diagnosis not present

## 2022-06-16 DIAGNOSIS — I779 Disorder of arteries and arterioles, unspecified: Secondary | ICD-10-CM | POA: Diagnosis not present

## 2022-06-23 DIAGNOSIS — H903 Sensorineural hearing loss, bilateral: Secondary | ICD-10-CM | POA: Diagnosis not present

## 2022-07-01 DIAGNOSIS — H903 Sensorineural hearing loss, bilateral: Secondary | ICD-10-CM | POA: Diagnosis not present

## 2022-07-23 DIAGNOSIS — R9431 Abnormal electrocardiogram [ECG] [EKG]: Secondary | ICD-10-CM | POA: Diagnosis not present

## 2022-07-23 DIAGNOSIS — R0989 Other specified symptoms and signs involving the circulatory and respiratory systems: Secondary | ICD-10-CM | POA: Diagnosis not present

## 2022-07-23 DIAGNOSIS — R079 Chest pain, unspecified: Secondary | ICD-10-CM | POA: Diagnosis not present

## 2022-07-23 DIAGNOSIS — I110 Hypertensive heart disease with heart failure: Secondary | ICD-10-CM | POA: Diagnosis not present

## 2022-07-23 DIAGNOSIS — R600 Localized edema: Secondary | ICD-10-CM | POA: Diagnosis not present

## 2022-07-23 DIAGNOSIS — I509 Heart failure, unspecified: Secondary | ICD-10-CM | POA: Diagnosis not present

## 2022-07-23 DIAGNOSIS — Z7982 Long term (current) use of aspirin: Secondary | ICD-10-CM | POA: Diagnosis not present

## 2022-07-23 DIAGNOSIS — Z886 Allergy status to analgesic agent status: Secondary | ICD-10-CM | POA: Diagnosis not present

## 2022-07-24 DIAGNOSIS — R9431 Abnormal electrocardiogram [ECG] [EKG]: Secondary | ICD-10-CM | POA: Diagnosis not present

## 2022-07-30 DIAGNOSIS — Z8572 Personal history of non-Hodgkin lymphomas: Secondary | ICD-10-CM | POA: Diagnosis not present

## 2022-07-30 DIAGNOSIS — I5022 Chronic systolic (congestive) heart failure: Secondary | ICD-10-CM | POA: Diagnosis not present

## 2022-08-26 DIAGNOSIS — E1122 Type 2 diabetes mellitus with diabetic chronic kidney disease: Secondary | ICD-10-CM | POA: Diagnosis not present

## 2022-08-26 DIAGNOSIS — M79602 Pain in left arm: Secondary | ICD-10-CM | POA: Diagnosis not present

## 2022-08-26 DIAGNOSIS — M4312 Spondylolisthesis, cervical region: Secondary | ICD-10-CM | POA: Diagnosis not present

## 2022-08-26 DIAGNOSIS — M47812 Spondylosis without myelopathy or radiculopathy, cervical region: Secondary | ICD-10-CM | POA: Diagnosis not present

## 2022-08-26 DIAGNOSIS — N1832 Chronic kidney disease, stage 3b: Secondary | ICD-10-CM | POA: Diagnosis not present

## 2022-08-26 DIAGNOSIS — E8589 Other amyloidosis: Secondary | ICD-10-CM | POA: Diagnosis not present

## 2022-10-20 DIAGNOSIS — I779 Disorder of arteries and arterioles, unspecified: Secondary | ICD-10-CM | POA: Diagnosis not present

## 2022-10-20 DIAGNOSIS — Z87891 Personal history of nicotine dependence: Secondary | ICD-10-CM | POA: Diagnosis not present

## 2022-10-20 DIAGNOSIS — I5022 Chronic systolic (congestive) heart failure: Secondary | ICD-10-CM | POA: Diagnosis not present

## 2022-10-20 DIAGNOSIS — Z1331 Encounter for screening for depression: Secondary | ICD-10-CM | POA: Diagnosis not present

## 2022-10-20 DIAGNOSIS — E118 Type 2 diabetes mellitus with unspecified complications: Secondary | ICD-10-CM | POA: Diagnosis not present

## 2022-10-20 DIAGNOSIS — I110 Hypertensive heart disease with heart failure: Secondary | ICD-10-CM | POA: Diagnosis not present

## 2022-10-20 DIAGNOSIS — Z0000 Encounter for general adult medical examination without abnormal findings: Secondary | ICD-10-CM | POA: Diagnosis not present

## 2023-04-28 DIAGNOSIS — E118 Type 2 diabetes mellitus with unspecified complications: Secondary | ICD-10-CM | POA: Diagnosis not present

## 2023-04-28 DIAGNOSIS — I779 Disorder of arteries and arterioles, unspecified: Secondary | ICD-10-CM | POA: Diagnosis not present

## 2023-04-28 DIAGNOSIS — C8339 Primary central nervous system lymphoma: Secondary | ICD-10-CM | POA: Diagnosis not present

## 2023-04-28 DIAGNOSIS — Z87891 Personal history of nicotine dependence: Secondary | ICD-10-CM | POA: Diagnosis not present

## 2023-04-28 DIAGNOSIS — E785 Hyperlipidemia, unspecified: Secondary | ICD-10-CM | POA: Diagnosis not present

## 2023-04-28 DIAGNOSIS — E119 Type 2 diabetes mellitus without complications: Secondary | ICD-10-CM | POA: Diagnosis not present

## 2023-04-28 DIAGNOSIS — I110 Hypertensive heart disease with heart failure: Secondary | ICD-10-CM | POA: Diagnosis not present

## 2023-04-28 DIAGNOSIS — I5022 Chronic systolic (congestive) heart failure: Secondary | ICD-10-CM | POA: Diagnosis not present

## 2023-04-28 DIAGNOSIS — I10 Essential (primary) hypertension: Secondary | ICD-10-CM | POA: Diagnosis not present

## 2023-04-28 DIAGNOSIS — Z23 Encounter for immunization: Secondary | ICD-10-CM | POA: Diagnosis not present

## 2024-06-09 DEATH — deceased
